# Patient Record
Sex: Female | Born: 2005 | Race: Black or African American | Hispanic: No | Marital: Single | Smoking: Never smoker
Health system: Southern US, Community
[De-identification: ages and names within clinical notes are randomized; demographics above are authoritative.]

## PROBLEM LIST (undated history)

## (undated) ENCOUNTER — Inpatient Hospital Stay (HOSPITAL_COMMUNITY): Payer: Self-pay

## (undated) ENCOUNTER — Emergency Department (HOSPITAL_COMMUNITY): Admission: EM | Payer: Medicaid Other | Source: Home / Self Care

## (undated) DIAGNOSIS — L309 Dermatitis, unspecified: Secondary | ICD-10-CM

## (undated) DIAGNOSIS — J45909 Unspecified asthma, uncomplicated: Secondary | ICD-10-CM

## (undated) DIAGNOSIS — M419 Scoliosis, unspecified: Secondary | ICD-10-CM

## (undated) HISTORY — DX: Scoliosis, unspecified: M41.9

## (undated) HISTORY — PX: NO PAST SURGERIES: SHX2092

---
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
## 2023-01-01 NOTE — ED Provider Notes (Signed)
[name]    CSN: [id number] Arrival date & time: 12/19/22  1848      History   Chief Complaint Chief Complaint  Patient presents with   Possible Pregnancy    HPI Sameya Buccellato is a 17 y.o. female.   Patient here today for evaluation of possible pregnancy as she has missed a period and has had breast tenderness. She has not had any fever. She reports she did take a pregnancy test at home she thought had two lines.   The history is provided by the patient.  Possible Pregnancy Pertinent negatives include no abdominal pain and no shortness of breath.    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 No pertinent surgical history.  OB History   No obstetric history on file.      Home Medications    Prior to Admission medications   Medication Sig Start Date End Date Taking? Authorizing Provider  Blood Pressure Monitoring (BLOOD PRESSURE KIT) DEVI 1 kit by Does not apply route once a week. Check Blood Pressure regularly and record readings into the Babyscripts App.  Large Cuff.  DX O90.0 Patient not taking: Reported on 08/01/2022 07/16/20   Constant, Peggy, MD  fluconazole (DIFLUCAN) 150 MG tablet Take one tab PO today and repeat dose in 3 days if symptoms persist 10/24/22   Tomi Bamberger, PA-C  hydrocortisone 2.5 % cream Apply 1 Application topically 2 (two) times daily. 10/13/22   [provider]  Hydrocortisone 2.5 % KIT Place 1 Application rectally as directed.    [provider]  letrozole (FEMARA) 2.5 MG tablet Take one tablet  daily for 5 days starting on Day 3 of your menstrual cycle (Day 1 is the first day of your period). 09/17/22   Leftwich-Kirby, Wilmer Floor, CNM  metroNIDAZOLE (FLAGYL) 500 MG tablet Take 1 tablet (500 mg total) by mouth 2 (two) times daily. 10/25/22   LampteyBritta Mccreedy, MD  oseltamivir (TAMIFLU) 75 MG capsule Take 1 capsule (75 mg total) by mouth every 12 (twelve) hours. Patient not taking: Reported on 08/01/2022 04/14/22   Tomi Bamberger, PA-C  triamcinolone cream (KENALOG) 0.5 % Apply 1 Application topically 2 (two) times daily. 12/07/21   [provider]  VENTOLIN HFA 108 (90 Base) MCG/ACT inhaler Inhale 2 puffs into the lungs every 6 (six) hours as needed. 08/25/22   [provider]    Family History Family History  Family history unknown: Yes    Social History Social History   Tobacco Use   Smoking status: Some Days    Types: Cigars   Smokeless tobacco: Never  Vaping Use   Vaping status: Never Used  Substance Use Topics   Alcohol use: Not Currently    Comment: Occ   Drug use: Yes    Types: Marijuana    Comment: Occassionally.     Allergies   Other, Peanuts [peanut oil], and Shellfish allergy   Review of Systems Review of Systems  Constitutional:  Negative for chills and fever.  Eyes:  Negative for discharge and redness.  Respiratory:  Negative for shortness of breath.   Gastrointestinal:  Negative for abdominal pain, nausea and vomiting.  Genitourinary:  Positive for menstrual problem. Negative for vaginal discharge.     Physical Exam Triage Vital Signs ED Triage Vitals  Encounter Vitals Group     BP 12/19/22 1954 117/75     Systolic BP Percentile --      Diastolic BP Percentile --      Pulse Rate 12/19/22 1954 66     Resp 12/19/22 1954 18     Temp 12/19/22 1954 98.1 F (36.7 C)     Temp Source 12/19/22 1954 Oral     SpO2 12/19/22 1954 96 %     Weight --      Height --      Head Circumference --      Peak Flow --      Pain Score 12/19/22 1955  0     Pain Loc --      Pain Education --      Exclude from Growth Chart --    No data found.  Updated Vital Signs BP 117/75 (BP Location: Left Arm)   Pulse 66   Temp 98.1 F (36.7 C) (Oral)   Resp 18   SpO2 96%      Physical Exam Vitals and nursing note reviewed.  Constitutional:      General: She is not in acute distress.    Appearance: Normal appearance. She is not ill-appearing.  HENT:     Head: Normocephalic and atraumatic.  Eyes:     Conjunctiva/sclera: Conjunctivae normal.  Cardiovascular:     Rate and Rhythm: Normal rate.  Pulmonary:     Effort: Pulmonary effort is normal. No respiratory distress.  Neurological:     Mental Status: She is alert.  Psychiatric:        Mood and Affect: Mood normal.        Behavior: Behavior normal.        Thought Content: Thought content normal.      UC Treatments / Results  Labs (all labs ordered are listed, but only abnormal results are displayed) Labs Reviewed  POCT URINE PREGNANCY    EKG   Radiology No results found.  Procedures Procedures (including critical care time)  Medications Ordered in UC Medications - No data to display  Initial Impression / Assessment and Plan / UC Course  I have reviewed the triage vital signs and the nursing notes.  Pertinent labs & imaging results that were available during my care of the patient were reviewed by me and considered in my medical decision making (see chart for details).    Pregnancy test negative in office.  Recommended she retake pregnancy screening at home if she does not start her period  In the next several weeks.  Encouraged sooner follow-up with any further concerns.  Final Clinical Impressions(s) / UC Diagnoses   Final diagnoses:  Pregnancy test negative  Feared complaint without diagnosis   Discharge Instructions   None    ED Prescriptions   None    PDMP not reviewed this encounter.   Tomi Bamberger, PA-C 01/01/23 1422

## 2023-03-09 ENCOUNTER — Other Ambulatory Visit (HOSPITAL_COMMUNITY)
Admission: RE | Admit: 2023-03-09 | Discharge: 2023-03-09 | Disposition: A | Discharge status: Home / Self Care | Payer: Medicaid Other | Source: Ambulatory Visit | Attending: Obstetrics | Admitting: Obstetrics

## 2023-03-09 ENCOUNTER — Ambulatory Visit (INDEPENDENT_AMBULATORY_CARE_PROVIDER_SITE_OTHER): Payer: Medicaid Other | Admitting: Obstetrics

## 2023-03-09 ENCOUNTER — Encounter: Payer: Self-pay | Admitting: Obstetrics

## 2023-03-09 DIAGNOSIS — N898 Other specified noninflammatory disorders of vagina: Secondary | ICD-10-CM | POA: Insufficient documentation

## 2023-03-09 DIAGNOSIS — N939 Abnormal uterine and vaginal bleeding, unspecified: Secondary | ICD-10-CM | POA: Diagnosis not present

## 2023-03-09 DIAGNOSIS — Z3202 Encounter for pregnancy test, result negative: Secondary | ICD-10-CM | POA: Diagnosis not present

## 2023-03-09 LAB — POCT URINE PREGNANCY: Preg Test, Ur: NEGATIVE

## 2023-03-09 NOTE — Progress Notes (Signed)
Pt. Presents for std testing. Pt. Wants to discuss irregular periods. Pt. Declines blood testing. Declines birth control.

## 2023-03-09 NOTE — Progress Notes (Unsigned)
Patient ID: Hannah Harrison, female   DOB: Jan 08, 2006, 17 y.o.   MRN: [id number]  No chief complaint on file.   HPI Hannah Harrison is a 17 y.o. female.  Complains of irregular periods.  Contraception:  none.  Also c/o vaginal discharge. HPI  Past Medical History:  Diagnosis Date   Asthma    Eczema    Scoliosis     History reviewed. No pertinent surgical history.  Family History  Family history unknown: Yes    Social History Social History   Tobacco Use   Smoking status: Some Days    Types: Cigars   Smokeless tobacco: Never  Vaping Use   Vaping status: Never Used  Substance Use Topics   Alcohol use: Not Currently    Comment: Occ   Drug use: Yes    Types: Marijuana    Comment: Occassionally.    Allergies  Allergen Reactions   Other     Seasonal allergies    Peanuts [Peanut Oil] Other (See Comments)    Throat swelling   Shellfish Allergy     Hives and throat swelling    Current Outpatient Medications  Medication Sig Dispense Refill   hydrocortisone 2.5 % cream Apply 1 Application topically 2 (two) times daily.     Hydrocortisone 2.5 % KIT Place 1 Application rectally as directed.     triamcinolone cream (KENALOG) 0.5 % Apply 1 Application topically 2 (two) times daily.     VENTOLIN HFA 108 (90 Base) MCG/ACT inhaler Inhale 2 puffs into the lungs every 6 (six) hours as needed.     Blood Pressure Monitoring (BLOOD PRESSURE KIT) DEVI 1 kit by Does not apply route once a week. Check Blood Pressure regularly and record readings into the Babyscripts App.  Large Cuff.  DX O90.0 (Patient not taking: Reported on 03/09/2023) 1 each 0   fluconazole (DIFLUCAN) 150 MG tablet Take one tab PO today and repeat dose in 3 days if symptoms persist (Patient not taking: Reported on 03/09/2023) 2 tablet 0   letrozole (FEMARA) 2.5 MG tablet Take one tablet daily for 5 days starting on Day 3 of your menstrual cycle (Day 1 is the first day of your period). (Patient not taking:  Reported on 03/09/2023) 5 tablet 1   metroNIDAZOLE (FLAGYL) 500 MG tablet Take 1 tablet (500 mg total) by mouth 2 (two) times daily. (Patient not taking: Reported on 03/09/2023) 14 tablet 0   oseltamivir (TAMIFLU) 75 MG capsule Take 1 capsule (75 mg total) by mouth every 12 (twelve) hours. (Patient not taking: Reported on 03/09/2023) 10 capsule 0   No current facility-administered medications for this visit.    Review of Systems Review of Systems Constitutional: negative for fatigue and weight loss Respiratory: negative for cough and wheezing Cardiovascular: negative for chest pain, fatigue and palpitations Gastrointestinal: negative for abdominal pain and change in bowel habits Genitourinary: positive for irregular periods and vaginal discharge Integument/breast: negative for nipple discharge Musculoskeletal:negative for myalgias Neurological: negative for gait problems and tremors Behavioral/Psych: negative for abusive relationship, depression Endocrine: negative for temperature intolerance      Last menstrual period 01/30/2023.  Physical Exam Physical Exam General:   Alert and no discharge  Skin:   no rash or abnormalities  Lungs:   clear to auscultation bilaterally  Heart:   regular rate and rhythm, S1, S2 normal, no murmur, click, rub or gallop  Breasts:   normal without suspicious masses, skin or nipple changes or axillary nodes  Abdomen:  normal findings: no organomegaly, soft, non-tender and no hernia  Pelvis:  External genitalia: normal general appearance Urinary system: urethral meatus normal and bladder without fullness, nontender Vaginal: normal without tenderness, induration or masses Cervix: normal appearance Adnexa: normal bimanual exam Uterus: anteverted and non-tender, normal size    I have spent a total of 20 minutes of face-to-face time, excluding clinical staff time, reviewing notes and preparing to see patient, ordering tests and/or medications, and  counseling the patient.   Data Reviewed Labs Wet Prep  Assessment     1. Abnormal uterine bleeding (AUB) (Primary) - O - POCT urine pregnancy:  Negative - recommended OCP's for cycle regulation  2. Vaginal discharge Rx: - Cervicovaginal ancillary only( [place])     Plan   Follow up prn  Orders Placed This Encounter  Procedures   POCT urine pregnancy    Brock Bad, MD, FACOG Attending Obstetrician & Gynecologist, [facility] for [facility], [facility], [place] 03/09/2023

## 2023-03-12 LAB — CERVICOVAGINAL ANCILLARY ONLY
Bacterial Vaginitis (gardnerella): POSITIVE — AB
Candida Glabrata: NEGATIVE
Candida Vaginitis: NEGATIVE
Chlamydia: NEGATIVE
Comment: NEGATIVE
Comment: NEGATIVE
Comment: NEGATIVE
Comment: NEGATIVE
Comment: NEGATIVE
Comment: NORMAL
Neisseria Gonorrhea: NEGATIVE
Trichomonas: NEGATIVE

## 2023-03-13 ENCOUNTER — Other Ambulatory Visit: Payer: Self-pay | Admitting: Family Medicine

## 2023-03-13 MED ORDER — METRONIDAZOLE 500 MG PO TABS: 500.0000 mg | ORAL_TABLET | Freq: Two times a day (BID) | ORAL | 0 refills | Status: AC

## 2023-03-14 NOTE — L&D Delivery Note (Signed)
 OB/GYN Faculty Practice Delivery Note  Hannah Harrison is a 18 y.o. G1P1001 s/p NVD at [redacted]w[redacted]d. She was admitted for IOL.   ROM: 7h 76m with clear fluid GBS Status: PRESUMPTIVE NEGATIVE/-- (10/17 1806)  Maximum Maternal Temperature: 98.1  Labor Progress: Initial SVE: 4/70/-1. She then progressed to complete.   Delivery Date/Time: 01/26/2024 9:24 AM Delivery: Called to room and patient was complete and pushing. Head delivered straight occiput anterior. nuchal cord absent. The anterior shoulder delivered with ease, followed by body dystocia. Infant was delivered >71min later. Cord was immediately clamped and cut, infant was handed off to the NICU team. Cord blood and venous gas drawn; artery not well visualized in the cord for arterial collection. Placenta delivered spontaneously with gentle cord traction. Fundus firm with massage and Pitocin. Labia, perineum, vagina, and cervix inspected and intact.  Placenta:  spontaneous Intact Complications:none Lacerations: none EBL: Analgesia: Epidural  Newborn Data Living status:Living Gender:Female Apgars:5 ,8  Weight:3420 g           Barabara Maier, DO [facility] for [employer]

## 2023-03-15 ENCOUNTER — Encounter: Payer: Self-pay | Admitting: Family Medicine

## 2023-03-29 ENCOUNTER — Other Ambulatory Visit: Payer: Self-pay

## 2023-03-29 ENCOUNTER — Encounter: Payer: Self-pay | Admitting: *Deleted

## 2023-03-29 ENCOUNTER — Ambulatory Visit
Admission: EM | Admit: 2023-03-29 | Discharge: 2023-03-29 | Disposition: A | Discharge status: Home / Self Care | Payer: Medicaid Other | Attending: Family Medicine | Admitting: Family Medicine

## 2023-03-29 DIAGNOSIS — R3915 Urgency of urination: Secondary | ICD-10-CM | POA: Insufficient documentation

## 2023-03-29 DIAGNOSIS — N898 Other specified noninflammatory disorders of vagina: Secondary | ICD-10-CM | POA: Insufficient documentation

## 2023-03-29 DIAGNOSIS — Z711 Person with feared health complaint in whom no diagnosis is made: Secondary | ICD-10-CM | POA: Insufficient documentation

## 2023-03-29 LAB — POCT URINALYSIS DIP (MANUAL ENTRY)
Bilirubin, UA: NEGATIVE
Blood, UA: NEGATIVE
Glucose, UA: NEGATIVE mg/dL
Ketones, POC UA: NEGATIVE mg/dL
Leukocytes, UA: NEGATIVE
Nitrite, UA: NEGATIVE
Protein Ur, POC: NEGATIVE mg/dL
Spec Grav, UA: 1.025 (ref 1.010–1.025)
Urobilinogen, UA: 0.2 U/dL
pH, UA: 6 (ref 5.0–8.0)

## 2023-03-29 MED ORDER — CEFTRIAXONE SODIUM 500 MG IJ SOLR
500.0000 mg | Freq: Once | INTRAMUSCULAR | Status: AC
  Administered 2023-03-29: 500 mg via INTRAMUSCULAR

## 2023-03-29 MED ORDER — DOXYCYCLINE HYCLATE 100 MG PO CAPS: 100.0000 mg | ORAL_CAPSULE | Freq: Two times a day (BID) | ORAL | 0 refills | Status: DC

## 2023-03-29 NOTE — ED Provider Notes (Signed)
[facility]   [id number] 03/29/23 Arrival Time: 1847  ASSESSMENT & PLAN:  1. Urinary urgency   2. Vaginal discharge   3. Concern about STD in female without diagnosis    Meds ordered this encounter  Medications   doxycycline (VIBRAMYCIN) 100 MG capsule    Sig: Take 1 capsule (100 mg total) by mouth 2 (two) times daily.    Dispense:  14 capsule    Refill:  0   cefTRIAXone (ROCEPHIN) injection 500 mg    Antibiotic Indication::   STD      Discharge Instructions      You have been given the following today for treatment of suspected gonorrhea and/or chlamydia:  cefTRIAXone (ROCEPHIN) injection 500 mg  Please pick up your prescription for doxycycline 100 mg and begin taking twice daily for the next seven (7) days.  Even though we have treated you today, we have sent testing for sexually transmitted infections. We will notify you of any positive results once they are received. If required, we will prescribe any medications you might need.  Please refrain from all sexual activity for at least the next seven days.     Without s/s of PID.  Labs Reviewed  POCT URINALYSIS DIP (MANUAL ENTRY)     Will notify of any positive results. Instructed to refrain from sexual activity for at least seven days.  Reviewed expectations re: course of current medical issues. Questions answered. Outlined signs and symptoms indicating need for more acute intervention. Patient verbalized understanding. After Visit Summary given.   SUBJECTIVE:  Hannah Harrison is a 18 y.o. female who presents with complaint of vaginal discharge and urinary urgency; few days; new female sexual partner. Questions UTI vs STI. No tx PTA.   Patient's last menstrual period was 03/18/2023 (approximate).   OBJECTIVE:  Vitals:   03/29/23 1937  BP: (!) 145/91  Pulse: 65  Resp: 16  Temp: 97.7 F (36.5 C)  TempSrc: Oral  SpO2: 98%    General appearance: alert, cooperative, appears stated  age and no distress Lungs: unlabored respirations; speaks full sentences without difficulty Back: no CVA tenderness; FROM at waist Abdomen: soft, non-tender GU: deferred Skin: warm and dry Psychological: alert and cooperative; normal mood and affect.  Results for orders placed or performed during the hospital encounter of 03/29/23  POCT urinalysis dipstick   Collection Time: 03/29/23  7:47 PM  Result Value Ref Range   Color, UA yellow yellow   Clarity, UA clear clear   Glucose, UA negative negative mg/dL   Bilirubin, UA negative negative   Ketones, POC UA negative negative mg/dL   Spec Grav, UA 4.098 1.191 - 1.025   Blood, UA negative negative   pH, UA 6.0 5.0 - 8.0   Protein Ur, POC negative negative mg/dL   Urobilinogen, UA 0.2 0.2 or 1.0 E.U./dL   Nitrite, UA Negative Negative   Leukocytes, UA Negative Negative    Labs Reviewed  POCT URINALYSIS DIP (MANUAL ENTRY)    Allergies  Allergen Reactions   Other     Seasonal allergies    Peanuts [Peanut Oil] Other (See Comments)    Throat swelling   Shellfish Allergy     Hives and throat swelling    Past Medical History:  Diagnosis Date   Asthma    Eczema    Scoliosis    Family History  Family history unknown: Yes   Social History   Socioeconomic History   Marital status: Single    Spouse  name: Not on file   Number of children: Not on file   Years of education: Not on file   Highest education level: Not on file  Occupational History   Not on file  Tobacco Use   Smoking status: Some Days    Types: Cigars   Smokeless tobacco: Never  Vaping Use   Vaping status: Never Used  Substance and Sexual Activity   Alcohol use: Not Currently    Comment: Occ   Drug use: Not Currently    Types: Marijuana    Comment: Occassionally.   Sexual activity: Yes    Partners: Male    Birth control/protection: None  Other Topics Concern   Not on file  Social History Narrative   ** Merged History Encounter **       **  Merged History Encounter **       Social Drivers of Corporate investment banker Strain: Not on file  Food Insecurity: Not on file  Transportation Needs: Not on file  Physical Activity: Not on file  Stress: Not on file  Social Connections: Not on file  Intimate Partner Violence: Not on file           [place], MD 03/29/23 [phone]

## 2023-03-29 NOTE — ED Triage Notes (Addendum)
Pt reports urinary frequency x 2 days. Denies pain. Reports "regular white discharge". Concerned for UTI. Also would like swab for STI

## 2023-03-29 NOTE — Discharge Instructions (Signed)

## 2023-03-30 ENCOUNTER — Telehealth (HOSPITAL_BASED_OUTPATIENT_CLINIC_OR_DEPARTMENT_OTHER): Payer: Self-pay

## 2023-03-30 LAB — CERVICOVAGINAL ANCILLARY ONLY
Bacterial Vaginitis (gardnerella): POSITIVE — AB
Candida Glabrata: NEGATIVE
Candida Vaginitis: POSITIVE — AB
Chlamydia: NEGATIVE
Comment: NEGATIVE
Comment: NEGATIVE
Comment: NEGATIVE
Comment: NEGATIVE
Comment: NEGATIVE
Comment: NORMAL
Neisseria Gonorrhea: NEGATIVE
Trichomonas: NEGATIVE

## 2023-03-30 MED ORDER — FLUCONAZOLE 150 MG PO TABS: 150.0000 mg | ORAL_TABLET | Freq: Once | ORAL | 0 refills | Status: AC

## 2023-03-30 MED ORDER — METRONIDAZOLE 500 MG PO TABS: 500.0000 mg | ORAL_TABLET | Freq: Two times a day (BID) | ORAL | 0 refills | Status: AC

## 2023-03-30 NOTE — Telephone Encounter (Signed)
Per protocol, pt requires tx with metronidazole and Diflucan.  Rx sent to pharmacy on file.

## 2023-04-01 ENCOUNTER — Encounter: Payer: Self-pay | Admitting: Obstetrics

## 2023-04-01 ENCOUNTER — Encounter: Payer: Self-pay | Admitting: Advanced Practice Midwife

## 2023-04-03 ENCOUNTER — Other Ambulatory Visit: Payer: Self-pay | Admitting: Advanced Practice Midwife

## 2023-04-03 MED ORDER — LETROZOLE 2.5 MG PO TABS: ORAL_TABLET | ORAL | 1 refills | Status: DC

## 2023-04-03 NOTE — Progress Notes (Signed)
Pt sent MyChart message requesting Femara Rx renewal. Rx for Femara was sent July 2024 with one refill.  Pt had follow up with another provider 03/09/23 but fertility was not discussed at that visit. The pt still desires pregnancy and does not want to take OCPs to regulate cycles right now. Rx for lowest dose Femara, 2.5 mg for pt to take Day 3-7 of her cycle with 1 refill.  F/U visit with me in the office in 3 months.

## 2023-05-25 ENCOUNTER — Ambulatory Visit: Payer: Self-pay

## 2023-05-26 ENCOUNTER — Other Ambulatory Visit: Payer: Self-pay

## 2023-05-26 ENCOUNTER — Ambulatory Visit
Admission: EM | Admit: 2023-05-26 | Discharge: 2023-05-26 | Disposition: A | Discharge status: Home / Self Care | Attending: Internal Medicine | Admitting: Internal Medicine

## 2023-05-26 ENCOUNTER — Encounter: Payer: Self-pay | Admitting: Emergency Medicine

## 2023-05-26 DIAGNOSIS — Z3201 Encounter for pregnancy test, result positive: Secondary | ICD-10-CM

## 2023-05-26 LAB — POCT URINE PREGNANCY: Preg Test, Ur: POSITIVE — AB

## 2023-05-26 NOTE — ED Triage Notes (Signed)
 Pt here for possible pregnancy test yesterday day at home; pt sts LMP was in February but unsure of date; pt sts some vaginal dryness

## 2023-05-26 NOTE — Discharge Instructions (Addendum)
 Urine pregnancy test here is positive.  hCG quantitative blood work sent off and this will be available in your MyChart in the next 24 to 48 hours.  Keep your follow-up appointment as scheduled with your OB/GYN on Monday.  List of safe medications given to you today to review.  Vaginal dryness may be associated with the pregnancy and recommend addressing this with your OB/GYN on Monday.  Follow-up as needed here.

## 2023-05-26 NOTE — ED Provider Notes (Signed)
 [name]    CSN: [id number] Arrival date & time: 05/26/23  1512      History   Chief Complaint Chief Complaint  Patient presents with   Possible Pregnancy    HPI Hannah Harrison is a 18 y.o. female.   18 year old female who presents urgent care with complaints of vaginal dryness and possible pregnancy.  The patient reports that her last menstrual period was sometime in February but she is unsure when.  She did a home pregnancy test yesterday that was positive.  She has had some soreness in her breast as well in the last week or 2.  She would like to have a quantitative blood work test done along with the urine pregnancy test.  She has an appointment scheduled with her OB/GYN on Monday.  She denies any abdominal pain, fevers, chills.   Possible Pregnancy Pertinent negatives include no chest pain, no abdominal pain and no shortness of breath.    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 No pertinent surgical history.  OB History     Gravida  1   Para      Term      Preterm      AB      Living         SAB      IAB      Ectopic      Multiple      Live Births               Home Medications    Prior to Admission medications   Medication Sig Start Date End Date Taking? Authorizing Provider  Blood Pressure Monitoring (BLOOD PRESSURE KIT) DEVI 1 kit by Does not apply route once a week. Check Blood Pressure regularly and record readings into the Babyscripts App.  Large Cuff.  DX O90.0 Patient not taking: Reported on  03/09/2023 07/16/20   Constant, Peggy, MD  doxycycline (VIBRAMYCIN) 100 MG capsule Take 1 capsule (100 mg total) by mouth 2 (two) times daily. 03/29/23   Mardella Layman, MD  hydrocortisone 2.5 % cream Apply 1 Application topically 2 (two) times daily. 10/13/22   [provider]  Hydrocortisone 2.5 % KIT Place 1 Application rectally as directed.    [provider]  letrozole (FEMARA) 2.5 MG tablet Take one tablet daily for 5 days starting on Day 3 of your menstrual cycle (Day 1 is the first day of your period). 04/03/23   Leftwich-Kirby, Wilmer Floor, CNM  triamcinolone cream (KENALOG) 0.5 % Apply 1 Application topically 2 (two) times daily. 12/07/21   [provider]  VENTOLIN HFA 108 (90 Base) MCG/ACT inhaler Inhale 2 puffs into the lungs every 6 (six) hours as needed. 08/25/22   [provider]    Family History Family History  Family history unknown: Yes    Social History Social History   Tobacco Use   Smoking status: Some Days    Types: Cigars   Smokeless tobacco: Never  Vaping Use   Vaping status: Never Used  Substance Use Topics  Alcohol use: Not Currently    Comment: Occ   Drug use: Not Currently    Types: Marijuana    Comment: Occassionally.     Allergies   Other, Peanuts [peanut oil], and Shellfish allergy   Review of Systems Review of Systems  Constitutional:  Negative for chills and fever.  HENT:  Negative for ear pain and sore throat.   Eyes:  Negative for pain and visual disturbance.  Respiratory:  Negative for cough and shortness of breath.   Cardiovascular:  Negative for chest pain and palpitations.  Gastrointestinal:  Negative for abdominal pain and vomiting.  Genitourinary:  Negative for dysuria and hematuria.       Vaginal dryness  Musculoskeletal:  Negative for arthralgias and back pain.  Skin:  Negative for color change and rash.  Neurological:  Negative for seizures and syncope.  All other systems reviewed and are  negative.    Physical Exam Triage Vital Signs ED Triage Vitals [05/26/23 1525]  Encounter Vitals Group     BP 123/76     Systolic BP Percentile      Diastolic BP Percentile      Pulse Rate 81     Resp 18     Temp 98.8 F (37.1 C)     Temp Source Oral     SpO2 98 %     Weight      Height      Head Circumference      Peak Flow      Pain Score 0     Pain Loc      Pain Education      Exclude from Growth Chart    No data found.  Updated Vital Signs BP 123/76 (BP Location: Left Arm)   Pulse 81   Temp 98.8 F (37.1 C) (Oral)   Resp 18   LMP  (LMP Unknown)   SpO2 98%   Visual Acuity Right Eye Distance:   Left Eye Distance:   Bilateral Distance:    Right Eye Near:   Left Eye Near:    Bilateral Near:     Physical Exam Vitals and nursing note reviewed.  Constitutional:      General: She is not in acute distress.    Appearance: She is well-developed.  HENT:     Head: Normocephalic and atraumatic.  Eyes:     Conjunctiva/sclera: Conjunctivae normal.  Cardiovascular:     Rate and Rhythm: Normal rate and regular rhythm.     Heart sounds: No murmur heard. Pulmonary:     Effort: Pulmonary effort is normal. No respiratory distress.     Breath sounds: Normal breath sounds.  Abdominal:     General: Bowel sounds are normal.     Palpations: Abdomen is soft.     Tenderness: There is no abdominal tenderness.  Musculoskeletal:        General: No swelling.     Cervical back: Neck supple.  Skin:    General: Skin is warm and dry.     Capillary Refill: Capillary refill takes less than 2 seconds.  Neurological:     Mental Status: She is alert.  Psychiatric:        Mood and Affect: Mood normal.      UC Treatments / Results  Labs (all labs ordered are listed, but only abnormal results are displayed) Labs Reviewed  POCT URINE PREGNANCY - Abnormal; Notable for the following components:      Result Value   Preg Test, Ur  Positive (*)    All other components within  normal limits  BETA HCG QUANT (REF LAB)    EKG   Radiology No results found.  Procedures Procedures (including critical care time)  Medications Ordered in UC Medications - No data to display  Initial Impression / Assessment and Plan / UC Course  I have reviewed the triage vital signs and the nursing notes.  Pertinent labs & imaging results that were available during my care of the patient were reviewed by me and considered in my medical decision making (see chart for details).     Positive pregnancy test - Plan: hCG Quantitative, hCG Quantitative   Urine pregnancy test here is positive.  hCG quantitative blood work sent off and this will be available in your MyChart in the next 24 to 48 hours.  Keep your follow-up appointment as scheduled with your OB/GYN on Monday.  List of safe medications given to you today to review.  Vaginal dryness may be associated with the pregnancy and recommend addressing this with your OB/GYN on Monday.  Follow-up as needed here.  Final Clinical Impressions(s) / UC Diagnoses   Final diagnoses:  Positive pregnancy test     Discharge Instructions      Urine pregnancy test here is positive.  hCG quantitative blood work sent off and this will be available in your MyChart in the next 24 to 48 hours.  Keep your follow-up appointment as scheduled with your OB/GYN on Monday.  List of safe medications given to you today to review.  Vaginal dryness may be associated with the pregnancy and recommend addressing this with your OB/GYN on Monday.  Follow-up as needed here.   ED Prescriptions   None    PDMP not reviewed this encounter.   Landis Martins, [place] 05/26/23 1549

## 2023-05-27 ENCOUNTER — Telehealth: Payer: Self-pay | Admitting: Emergency Medicine

## 2023-05-27 LAB — BETA HCG QUANT (REF LAB): hCG Quant: 2583 m[IU]/mL

## 2023-05-27 NOTE — Telephone Encounter (Signed)
 Returned pt call; informed to keep OB appointment tomorrow and that her lab work indicated she was around [redacted] weeks pregnant

## 2023-05-28 ENCOUNTER — Ambulatory Visit: Admitting: Advanced Practice Midwife

## 2023-05-29 ENCOUNTER — Encounter: Payer: Self-pay | Admitting: Advanced Practice Midwife

## 2023-06-07 ENCOUNTER — Other Ambulatory Visit: Payer: Self-pay

## 2023-06-07 ENCOUNTER — Encounter: Payer: Self-pay | Admitting: Advanced Practice Midwife

## 2023-06-07 MED ORDER — PRENATAL 28-0.8 MG PO TABS: 1.0000 | ORAL_TABLET | Freq: Every day | ORAL | 12 refills | Status: AC

## 2023-06-07 MED ORDER — FLUTICASONE PROPIONATE 50 MCG/ACT NA SUSP: 1.0000 | Freq: Every day | NASAL | 0 refills | Status: DC | PRN

## 2023-06-07 NOTE — Progress Notes (Signed)
 Flonase refill provided due to pt allergies. Flonase on safe medication list for pregnancy, pt has been taking each year for allergies.

## 2023-06-07 NOTE — Progress Notes (Signed)
 PNV rx sent per pt request

## 2023-06-13 ENCOUNTER — Other Ambulatory Visit: Payer: Self-pay | Admitting: *Deleted

## 2023-06-13 ENCOUNTER — Encounter: Payer: Self-pay | Admitting: *Deleted

## 2023-06-13 ENCOUNTER — Encounter

## 2023-06-14 ENCOUNTER — Other Ambulatory Visit (INDEPENDENT_AMBULATORY_CARE_PROVIDER_SITE_OTHER): Payer: Self-pay

## 2023-06-14 ENCOUNTER — Ambulatory Visit: Admitting: *Deleted

## 2023-06-14 ENCOUNTER — Encounter: Payer: Self-pay | Admitting: Advanced Practice Midwife

## 2023-06-14 VITALS — BP 119/75 | HR 78 | Wt 118.9 lb

## 2023-06-14 DIAGNOSIS — Z3A01 Less than 8 weeks gestation of pregnancy: Secondary | ICD-10-CM

## 2023-06-14 DIAGNOSIS — O3680X0 Pregnancy with inconclusive fetal viability, not applicable or unspecified: Secondary | ICD-10-CM

## 2023-06-14 DIAGNOSIS — Z3401 Encounter for supervision of normal first pregnancy, first trimester: Secondary | ICD-10-CM | POA: Diagnosis not present

## 2023-06-14 DIAGNOSIS — Z3480 Encounter for supervision of other normal pregnancy, unspecified trimester: Secondary | ICD-10-CM

## 2023-06-14 MED ORDER — BLOOD PRESSURE KIT DEVI: 1.0000 | 0 refills | Status: DC

## 2023-06-14 NOTE — Progress Notes (Signed)
 New OB Intake  I connected with Hannah Harrison  on 06/14/23 at 10:15 AM EDT by In Person Visit and verified that I am speaking with the correct person using two identifiers. Nurse is located at [facility] and pt is located at [place].  I discussed the limitations, risks, security and privacy concerns of performing an evaluation and management service by telephone and the availability of in person appointments. I also discussed with the patient that there may be a patient responsible charge related to this service. The patient expressed understanding and agreed to proceed.  I explained I am completing New OB Intake today. We discussed EDD of 01/29/2024, by Ultrasound. Pt is G1P0000. I reviewed her allergies, medications and Medical/Surgical/OB history.    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. Discussed the nature of our practice with multiple providers including residents and students. Due to the size of the practice, the delivering provider may not be the same as those providing prenatal care.   Patient is not interested in water birth. Offered upcoming OB visit with CNM to discuss further.  MyChart/Babyscripts MyChart access verified. I explained pt will have some visits in office and some virtually. Babyscripts instructions given and order placed. Patient verifies receipt of registration text/e-mail. Account successfully created and app downloaded. If patient is a candidate for Optimized scheduling, add to sticky note.   Blood Pressure  Cuff/Weight Scale Blood pressure cuff ordered for patient to pick-up from [employer]. Explained after first prenatal appt pt will check weekly and document in Babyscripts. Patient does not have weight scale; patient may purchase if they desire to track weight weekly in Babyscripts.  Anatomy US Explained first scheduled [place] will be around 19 weeks. Anatomy US scheduled for TBD at TBD.  Interested in [place]? If yes, send referral and doula dot phrase.   Is patient a candidate for Babyscripts Optimization? Yes, patient accepted    First visit review I reviewed new OB appt with patient. Explained pt will be seen by Sharen Counter, CNM  at first visit. Discussed Avelina Laine genetic screening with patient. Requests Panorama and Horizon.. Routine prenatal labs  OB Urine, GC/CC only collected at today's visit. Initial OB labs and Natera deferred to New OB appt.    Last Pap No results found for: "DIAGPAP"  Harrel Lemon, RN 06/14/2023  1:48 PM

## 2023-06-15 ENCOUNTER — Telehealth: Payer: Self-pay

## 2023-06-16 ENCOUNTER — Encounter: Payer: Self-pay | Admitting: Advanced Practice Midwife

## 2023-06-16 LAB — URINE CULTURE, OB REFLEX

## 2023-06-16 LAB — CULTURE, OB URINE

## 2023-07-03 ENCOUNTER — Other Ambulatory Visit (HOSPITAL_COMMUNITY)
Admission: RE | Admit: 2023-07-03 | Discharge: 2023-07-03 | Disposition: A | Discharge status: Home / Self Care | Source: Ambulatory Visit | Attending: Advanced Practice Midwife | Admitting: Advanced Practice Midwife

## 2023-07-03 ENCOUNTER — Encounter: Payer: Self-pay | Admitting: Advanced Practice Midwife

## 2023-07-03 ENCOUNTER — Ambulatory Visit: Admitting: Advanced Practice Midwife

## 2023-07-03 VITALS — BP 125/71 | HR 82 | Wt 120.8 lb

## 2023-07-03 DIAGNOSIS — Z3A10 10 weeks gestation of pregnancy: Secondary | ICD-10-CM | POA: Diagnosis not present

## 2023-07-03 DIAGNOSIS — Z3401 Encounter for supervision of normal first pregnancy, first trimester: Secondary | ICD-10-CM

## 2023-07-03 DIAGNOSIS — Z3480 Encounter for supervision of other normal pregnancy, unspecified trimester: Secondary | ICD-10-CM | POA: Diagnosis present

## 2023-07-03 DIAGNOSIS — Z31430 Encounter of female for testing for genetic disease carrier status for procreative management: Secondary | ICD-10-CM | POA: Diagnosis not present

## 2023-07-03 DIAGNOSIS — O219 Vomiting of pregnancy, unspecified: Secondary | ICD-10-CM

## 2023-07-03 DIAGNOSIS — B3731 Acute candidiasis of vulva and vagina: Secondary | ICD-10-CM

## 2023-07-03 MED ORDER — TERCONAZOLE 0.4 % VA CREA: 1.0000 | TOPICAL_CREAM | Freq: Every day | VAGINAL | 0 refills | Status: DC

## 2023-07-03 MED ORDER — ONDANSETRON HCL 4 MG PO TABS: 4.0000 mg | ORAL_TABLET | Freq: Three times a day (TID) | ORAL | 0 refills | Status: DC | PRN

## 2023-07-03 NOTE — Progress Notes (Signed)
 Pt states she is going to pick up her BP kit today.   Pt is concerned she may have a yeast infection.

## 2023-07-03 NOTE — Progress Notes (Signed)
 Subjective:   Hannah Harrison is a 18 y.o. G1P0000 at [redacted]w[redacted]d by early ultrasound being seen today for her first obstetrical visit. This is a planned pregnancy. Pt was taking ovulation stimulation medication to conceive.  Her obstetrical history is significant for  none G1  and has Intentional selective serotonin reuptake inhibitor overdose (HCC); Intentional drug overdose (HCC); Adjustment disorder with disturbance of conduct; PCOS (polycystic ovarian syndrome); Primary oligomenorrhea; Desire for pregnancy; and Supervision of other normal pregnancy, antepartum on their problem list.. Patient does intend to breast feed. Pregnancy history fully reviewed.  Patient reports  mild nausea with occasional vomiting .  HISTORY: OB History  Gravida Para Term Preterm AB Living  1 0 0 0 0 0  SAB IAB Ectopic Multiple Live Births  0 0 0 0 0    # Outcome Date GA Lbr Len/2nd Weight Sex Type Anes PTL Lv  1 Current            Past Medical History:  Diagnosis Date   Asthma    Eczema    Scoliosis    Past Surgical History:  Procedure Laterality Date   NO PAST SURGERIES     Family History  Problem Relation Age of Onset   Hyperlipidemia Maternal Grandmother    Social History   Tobacco Use   Smoking status: Former    Types: Cigars   Smokeless tobacco: Never  Vaping Use   Vaping status: Never Used  Substance Use Topics   Alcohol use: Not Currently    Comment: Occ   Drug use: Not Currently    Types: Marijuana    Comment: Occassionally.   Allergies  Allergen Reactions   Other     Seasonal allergies    Peanuts [Peanut Oil] Other (See Comments)    Throat swelling   Shellfish Allergy     Hives and throat swelling   Current Outpatient Medications on File Prior to Visit  Medication Sig Dispense Refill   Prenatal 28-0.8 MG TABS Take 1 tablet by mouth daily. 30 tablet 12   Blood Pressure Monitoring (BLOOD PRESSURE KIT) DEVI 1 kit by Does not apply route once a week. Check Blood  Pressure regularly and record readings into the Babyscripts App.  Large Cuff.  DX O90.0 (Patient not taking: Reported on 08/01/2022) 1 each 0   Blood Pressure Monitoring (BLOOD PRESSURE KIT) DEVI 1 Device by Does not apply route once a week. (Patient not taking: Reported on 07/03/2023) 1 each 0   doxycycline  (VIBRAMYCIN ) 100 MG capsule Take 1 capsule (100 mg total) by mouth 2 (two) times daily. (Patient not taking: Reported on 07/03/2023) 14 capsule 0   fluticasone  (ALLERGY SPRAY 24 HOUR) 50 MCG/ACT nasal spray Place 1 spray into both nostrils daily as needed (allergies). (Patient not taking: Reported on 07/03/2023) 16 g 0   hydrocortisone 2.5 % cream Apply 1 Application topically 2 (two) times daily. (Patient not taking: Reported on 07/03/2023)     Hydrocortisone 2.5 % KIT Place 1 Application rectally as directed. (Patient not taking: Reported on 07/03/2023)     letrozole  (FEMARA ) 2.5 MG tablet Take one tablet daily for 5 days starting on Day 3 of your menstrual cycle (Day 1 is the first day of your period). (Patient not taking: Reported on 07/03/2023) 5 tablet 1   triamcinolone  cream (KENALOG ) 0.5 % Apply 1 Application topically 2 (two) times daily. (Patient not taking: Reported on 07/03/2023)     VENTOLIN  HFA 108 (90 Base) MCG/ACT inhaler  Inhale 2 puffs into the lungs every 6 (six) hours as needed. (Patient not taking: Reported on 07/03/2023)     No current facility-administered medications on file prior to visit.     Indications for ASA therapy (per uptodate) One of the following: Previous pregnancy with preeclampsia, especially early onset and with an adverse outcome No Multifetal gestation No Chronic hypertension No Type 1 or 2 diabetes mellitus No Chronic kidney disease No Autoimmune disease (antiphospholipid syndrome, systemic lupus erythematosus) No   Two or more of the following: Nulliparity Yes Obesity (body mass index >30 kg/m2) No Family history of preeclampsia in mother or sister  No Age >=35 years No Sociodemographic characteristics (African American race, low socioeconomic level) Yes Personal risk factors (eg, previous pregnancy with low birth weight or small for gestational age infant, previous adverse pregnancy outcome [eg, stillbirth], interval >10 years between pregnancies) No   Indications for early 1 hour GTT (per uptodate) No BMI 20  Exam   Vitals:   07/03/23 1324  BP: 125/71  Pulse: 82  Weight: 120 lb 12.8 oz (54.8 kg)   Fetal Heart Rate (bpm): 170  VS reviewed, nursing note reviewed,  Constitutional: well developed, well nourished, no distress HEENT: normocephalic CV: normal rate Pulm/chest wall: normal effort Abdomen: soft Neuro: alert and oriented x 3 Skin: warm, dry Psych: affect normal    Assessment:   Pregnancy: G1P0000 Patient Active Problem List   Diagnosis Date Noted   Supervision of other normal pregnancy, antepartum 06/14/2023   Desire for pregnancy 08/02/2022   PCOS (polycystic ovarian syndrome) 01/06/2021   Primary oligomenorrhea 01/06/2021   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 Supervision of other normal pregnancy, antepartum (Primary) --Anticipatory guidance about next visits/weeks of pregnancy given.   - CBC/D/Plt+RPR+Rh+ABO+RubIgG... - Cervicovaginal ancillary only( [place])  2. [redacted] weeks gestation of pregnancy  - CBC/D/Plt+RPR+Rh+ABO+RubIgG... - Cervicovaginal ancillary only( [place])  3. Encounter for supervision of normal first pregnancy in first trimester  - PANORAMA PRENATAL TEST  4. Encounter of female for testing for genetic disease carrier status for procreative management  - HORIZON Basic Panel  5. Nausea and vomiting during pregnancy prior to [redacted] weeks gestation --Pt reports getting up early for work on the weekends is hard with  n/v.  Will treat n/v first with medication. Pt to notify office if she is still unable to work in the mornings.  - ondansetron  (ZOFRAN ) 4 MG tablet; Take 1 tablet (4 mg total) by mouth every 8 (eight) hours as needed for nausea or vomiting.  Dispense: 20 tablet; Refill: 0   6. Vaginal candidiasis --Pt tx for yeast recently, symptoms returned.  Swab collected today.  - terconazole  (TERAZOL 7 ) 0.4 % vaginal cream; Place 1 applicator vaginally at bedtime.  Dispense: 45 g; Refill: 0   Initial labs drawn. Continue prenatal vitamins. Discussed and offered genetic screening options, including Quad screen/AFP, NIPS testing, and option to decline testing. Benefits/risks/alternatives reviewed. Pt aware that anatomy US  is form of genetic screening with lower accuracy in detecting trisomies than blood work.  Pt chooses genetic screening today. NIPS: ordered. Ultrasound discussed; fetal anatomic survey: ordered. Problem list reviewed and updated. The nature of [facility] with multiple MDs and other Advanced Practice Providers was explained to patient; also emphasized that residents, students are part of our team. Routine  obstetric precautions reviewed. Return in about 4 weeks (around 07/31/2023) for LOB.   Arlester Bence, CNM 07/03/23 2:07 PM

## 2023-07-03 NOTE — Progress Notes (Signed)
Pt presents for NOB visit.

## 2023-07-04 LAB — CERVICOVAGINAL ANCILLARY ONLY
Bacterial Vaginitis (gardnerella): POSITIVE — AB
Candida Glabrata: NEGATIVE
Candida Vaginitis: POSITIVE — AB
Chlamydia: NEGATIVE
Comment: NEGATIVE
Comment: NEGATIVE
Comment: NEGATIVE
Comment: NEGATIVE
Comment: NEGATIVE
Comment: NORMAL
Neisseria Gonorrhea: NEGATIVE
Trichomonas: NEGATIVE

## 2023-07-05 ENCOUNTER — Telehealth: Payer: Self-pay

## 2023-07-05 LAB — CBC/D/PLT+RPR+RH+ABO+RUBIGG...
Antibody Screen: NEGATIVE
Basophils Absolute: 0 10*3/uL (ref 0.0–0.2)
Basos: 1 %
EOS (ABSOLUTE): 0.2 10*3/uL (ref 0.0–0.4)
Eos: 3 %
HCV Ab: NONREACTIVE
HIV Screen 4th Generation wRfx: NONREACTIVE
Hematocrit: 36.3 % (ref 34.0–46.6)
Hemoglobin: 12.5 g/dL (ref 11.1–15.9)
Hepatitis B Surface Ag: NEGATIVE
Immature Grans (Abs): 0 10*3/uL (ref 0.0–0.1)
Immature Granulocytes: 0 %
Lymphocytes Absolute: 1.7 10*3/uL (ref 0.7–3.1)
Lymphs: 23 %
MCH: 31.7 pg (ref 26.6–33.0)
MCHC: 34.4 g/dL (ref 31.5–35.7)
MCV: 92 fL (ref 79–97)
Monocytes Absolute: 0.6 10*3/uL (ref 0.1–0.9)
Monocytes: 8 %
Neutrophils Absolute: 4.7 10*3/uL (ref 1.4–7.0)
Neutrophils: 65 %
Platelets: 218 10*3/uL (ref 150–450)
RBC: 3.94 x10E6/uL (ref 3.77–5.28)
RDW: 14.6 % (ref 11.7–15.4)
RPR Ser Ql: NONREACTIVE
Rh Factor: POSITIVE
Rubella Antibodies, IGG: 2.81 {index} (ref >0.99)
WBC: 7.2 10*3/uL (ref 3.4–10.8)

## 2023-07-05 LAB — HCV INTERPRETATION

## 2023-07-05 NOTE — Telephone Encounter (Signed)
 TC from [facility] questioning lab results. Wanted to know about CBC, blood type, etc. Informed of AB+ blood type, normal hemoglobin/iron levels, informed of no STDs and no abnormal readings on her blood results. Pt voiced understanding.

## 2023-07-06 ENCOUNTER — Other Ambulatory Visit: Payer: Self-pay

## 2023-07-06 MED ORDER — METRONIDAZOLE 500 MG PO TABS: 500.0000 mg | ORAL_TABLET | Freq: Two times a day (BID) | ORAL | 0 refills | Status: DC

## 2023-07-06 NOTE — Progress Notes (Signed)
Flagyl rx sent for BV

## 2023-07-08 LAB — PANORAMA PRENATAL TEST FULL PANEL:PANORAMA TEST PLUS 5 ADDITIONAL MICRODELETIONS: FETAL FRACTION: 9.6

## 2023-07-09 ENCOUNTER — Other Ambulatory Visit: Payer: Self-pay | Admitting: *Deleted

## 2023-07-09 DIAGNOSIS — B9689 Other specified bacterial agents as the cause of diseases classified elsewhere: Secondary | ICD-10-CM

## 2023-07-09 MED ORDER — METRONIDAZOLE 0.75 % VA GEL: 1.0000 | Freq: Every day | VAGINAL | 1 refills | Status: DC

## 2023-07-09 NOTE — Progress Notes (Signed)
 Pt requested RX Metrogel  instead of PO Flagyl . RX sent per protocol.

## 2023-07-11 ENCOUNTER — Encounter: Payer: Self-pay | Admitting: Advanced Practice Midwife

## 2023-07-11 LAB — HORIZON CUSTOM: REPORT SUMMARY: NEGATIVE

## 2023-08-02 ENCOUNTER — Ambulatory Visit (INDEPENDENT_AMBULATORY_CARE_PROVIDER_SITE_OTHER): Admitting: Obstetrics and Gynecology

## 2023-08-02 ENCOUNTER — Encounter: Payer: Self-pay | Admitting: Obstetrics and Gynecology

## 2023-08-02 VITALS — BP 128/75 | HR 77 | Wt 129.4 lb

## 2023-08-02 DIAGNOSIS — Z3A14 14 weeks gestation of pregnancy: Secondary | ICD-10-CM

## 2023-08-02 DIAGNOSIS — Z3480 Encounter for supervision of other normal pregnancy, unspecified trimester: Secondary | ICD-10-CM | POA: Diagnosis not present

## 2023-08-02 NOTE — Patient Instructions (Signed)

## 2023-08-02 NOTE — Progress Notes (Signed)
No concerns at this time. `

## 2023-08-02 NOTE — Progress Notes (Signed)
   PRENATAL VISIT NOTE  Subjective:  Hannah Harrison is a 18 y.o. G1P0000 at [redacted]w[redacted]d being seen today for ongoing prenatal care.  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  Patient reports no complaints.  Contractions: Not present. Vag. Bleeding: None.  Movement: Increased. Denies leaking of fluid.   The following portions of the patient's history were reviewed and updated as appropriate: allergies, current medications, past family history, past medical history, past social history, past surgical history and problem list.   Objective:    Vitals:   08/02/23 1001  BP: 128/75  Pulse: 77  Weight: 129 lb 6.4 oz (58.7 kg)    Fetal Status:  Fetal Heart Rate (bpm): 147   Movement: Increased    General: Alert, oriented and cooperative. Patient is in no acute distress.  Skin: Skin is warm and dry. No rash noted.   Cardiovascular: Normal heart rate noted  Respiratory: Normal respiratory effort, no problems with respiration noted  Abdomen: Soft, gravid, appropriate for gestational age.  Pain/Pressure: Absent     Pelvic: Cervical exam deferred        Extremities: Normal range of motion.  Edema: None  Mental Status: Normal mood and affect. Normal behavior. Normal judgment and thought content.   Assessment and Plan:  Pregnancy: G1P0000 at [redacted]w[redacted]d 1. Supervision of other normal pregnancy, antepartum (Primary) BP and FHR normal Doing well overall    2. [redacted] weeks gestation of pregnancy Anatomy scan 6/24 Peds list given today   Preterm labor symptoms and general obstetric precautions including but not limited to vaginal bleeding, contractions,  leaking of fluid and fetal movement were reviewed in detail with the patient. Please refer to After Visit Summary for other counseling recommendations.   Return in about 4 weeks (around 08/30/2023) for OB VISIT (MD or APP).  Future Appointments  Date Time Provider Department Center  09/04/2023  1:00 PM [facility] PROVIDER 1 [facility]  09/04/2023  1:30 PM [facility] US3 [facility]    Susi Eric, FNP

## 2023-08-03 ENCOUNTER — Other Ambulatory Visit: Payer: Self-pay | Admitting: Obstetrics

## 2023-08-31 ENCOUNTER — Ambulatory Visit

## 2023-09-03 ENCOUNTER — Encounter: Payer: Self-pay | Admitting: Obstetrics and Gynecology

## 2023-09-03 ENCOUNTER — Ambulatory Visit (INDEPENDENT_AMBULATORY_CARE_PROVIDER_SITE_OTHER): Admitting: Obstetrics and Gynecology

## 2023-09-03 VITALS — BP 121/79 | HR 81 | Wt 142.5 lb

## 2023-09-03 DIAGNOSIS — Z3480 Encounter for supervision of other normal pregnancy, unspecified trimester: Secondary | ICD-10-CM | POA: Diagnosis not present

## 2023-09-03 DIAGNOSIS — Z3A18 18 weeks gestation of pregnancy: Secondary | ICD-10-CM | POA: Diagnosis not present

## 2023-09-03 NOTE — Progress Notes (Signed)
   PRENATAL VISIT NOTE  Subjective:  Hannah Harrison is a 18 y.o. G1P0000 at [redacted]w[redacted]d being seen today for ongoing prenatal care.  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  Patient reports no complaints.  Contractions: Not present. Vag. Bleeding: None.  Movement: Increased. Denies leaking of fluid.   The following portions of the patient's history were reviewed and updated as appropriate: allergies, current medications, past family history, past medical history, past social history, past surgical history and problem list.   Objective:    Vitals:   09/03/23 0938  BP: 121/79  Pulse: 81  Weight: 142 lb 8 oz (64.6 kg)    Fetal Status:  Fetal Heart Rate (bpm): 159   Movement: Increased    General: Alert, oriented and cooperative. Patient is in no acute distress.  Skin: Skin is warm and dry. No rash noted.   Cardiovascular: Normal heart rate noted  Respiratory: Normal respiratory effort, no problems with respiration noted  Abdomen: Soft, gravid, appropriate for gestational age.  Pain/Pressure: Present     Pelvic: Cervical exam deferred        Extremities: Normal range of motion.  Edema: None  Mental Status: Normal mood and affect. Normal behavior. Normal judgment and thought content.   Assessment and Plan:  Pregnancy: G1P0000 at [redacted]w[redacted]d 1. [redacted] weeks gestation of pregnancy   2. Supervision of other normal pregnancy, antepartum (Primary) Patient is doing well without complaints AFP today Anatomy ultrasound scheduled tomorrow  Preterm labor symptoms and general obstetric precautions including but not limited to vaginal bleeding, contractions, leaking of fluid and fetal movement were  reviewed in detail with the patient. Please refer to After Visit Summary for other counseling recommendations.   Return in about 4 weeks (around 10/01/2023) for in person, ROB, Low risk.  Future Appointments  Date Time Provider Department Center  09/04/2023  1:00 PM [facility] PROVIDER 1 [facility]  09/04/2023  1:30 PM [facility] US1 [facility]    Winton Felt, MD

## 2023-09-03 NOTE — Progress Notes (Signed)
 Pt states everything is going well. No concerns at this time.

## 2023-09-04 ENCOUNTER — Ambulatory Visit: Attending: Obstetrics and Gynecology

## 2023-09-04 ENCOUNTER — Ambulatory Visit (HOSPITAL_BASED_OUTPATIENT_CLINIC_OR_DEPARTMENT_OTHER): Admitting: Maternal & Fetal Medicine

## 2023-09-04 VITALS — BP 118/65 | HR 84

## 2023-09-04 DIAGNOSIS — Z8659 Personal history of other mental and behavioral disorders: Secondary | ICD-10-CM | POA: Insufficient documentation

## 2023-09-04 DIAGNOSIS — Z3480 Encounter for supervision of other normal pregnancy, unspecified trimester: Secondary | ICD-10-CM | POA: Diagnosis present

## 2023-09-04 DIAGNOSIS — Z3689 Encounter for other specified antenatal screening: Secondary | ICD-10-CM | POA: Insufficient documentation

## 2023-09-04 DIAGNOSIS — Z3A19 19 weeks gestation of pregnancy: Secondary | ICD-10-CM

## 2023-09-04 DIAGNOSIS — E282 Polycystic ovarian syndrome: Secondary | ICD-10-CM | POA: Insufficient documentation

## 2023-09-04 DIAGNOSIS — O2692 Pregnancy related conditions, unspecified, second trimester: Secondary | ICD-10-CM | POA: Diagnosis not present

## 2023-09-04 NOTE — Progress Notes (Signed)
 Patient information  Patient Name: Hannah Harrison [facility]  Patient MRN:   [id number]  Referring practice: MFM Referring Provider: [place] [facility]  Problem List   Patient Active Problem List   Diagnosis Date Noted   History of suicidal ideation (2020) 09/04/2023   Supervision of other normal pregnancy, antepartum 06/14/2023   PCOS (polycystic ovarian syndrome) 01/06/2021    Maternal Fetal Medicine Consult Hannah Harrison is a 18 y.o. G1P0000 at [redacted]w[redacted]d here for ultrasound and consultation. She had Low risk aneuploidy screening of a female fetus. Carrier screening was Negative for the basic screening (SMA, alpha-thal, beta-thal, and cystic fibroisis. Maternal serum AFP n/a. She has no acute concerns.   Sonographic findings Single intrauterine pregnancy at 19w 0d. Fetal cardiac activity:  Observed and appears normal. Presentation: Transverse, head to maternal right. The anatomic structures that were well seen appear normal without evidence of soft markers. The anatomic survey is complete.  Fetal biometry shows the estimated fetal weight at the 80 percentile. Amniotic fluid: Within normal limits.  MVP: 7.38 cm. Placenta: Anterior. Adnexa: No adnexal mass visualized. Cervical length: 4 cm.  There are limitations of prenatal ultrasound such as the inability to detect certain abnormalities due to poor visualization. Various factors such as fetal position, gestational age and maternal body habitus may increase the difficulty in visualizing the fetal anatomy.    Recommendations -EDD should be 01/29/2024 based on  Early Ultrasound  (06/14/23). -No further ultrasounds are recommended at this time based on the current indications. If future indications arise (e.g. size/date discrepancy on fundal height, gestational diabetes or hypertension) and an ultrasound is to be desired at our MFM office, please send a referral.   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 16 mL 5   Prenatal 28-0.8 MG TABS Take 1 tablet by mouth daily. 30 tablet 12   VENTOLIN  HFA 108 (90 Base) MCG/ACT inhaler Inhale 2 puffs into the lungs every 6 (six) hours as needed.     Blood Pressure Monitoring (BLOOD PRESSURE KIT) DEVI 1 kit by Does not apply route once a week. Check Blood Pressure regularly and record readings into the Babyscripts App.  Large Cuff.  DX O90.0 (Patient not taking: Reported on 09/03/2023) 1 each 0   Blood Pressure Monitoring (BLOOD PRESSURE KIT) DEVI 1 Device by Does not apply route once a week. (Patient not taking: Reported on  09/03/2023) 1 each 0   Hydrocortisone 2.5 % KIT Place 1 Application rectally as directed. (Patient not taking: Reported on 09/03/2023)     metroNIDAZOLE  (METROGEL ) 0.75 % vaginal gel Place 1 Applicatorful vaginally at bedtime. Apply one applicatorful to vagina at bedtime for 5 days (Patient not taking: Reported on 09/04/2023) 70 g 1   triamcinolone  cream (KENALOG ) 0.5 % Apply 1 Application topically 2 (two) times daily. (Patient not taking: Reported on  09/03/2023)     No current facility-administered medications on file prior to visit.      Allergies:   Allergies  Allergen Reactions   Other     Seasonal allergies    Peanuts [Peanut Oil] Other (See Comments)    Throat swelling   Shellfish Allergy     Hives and throat swelling     Physical Exam:   Vitals:   09/04/23 1327  BP: 118/65  Pulse: 84   Sitting comfortably on the sonogram table Nonlabored breathing Normal rate and rhythm Abdomen is nontender  Thank you for the opportunity to be involved with this patient's care. Please let us  know if we can be of any further assistance.   30 minutes of time was spent reviewing the patient's chart including labs, imaging and documentation.  At least 50% of this time was spent with direct patient care discussing the diagnosis, management and prognosis of her care.  Hannah Harrison MFM, [place]   09/04/2023  2:18 PM

## 2023-09-05 LAB — AFP, SERUM, OPEN SPINA BIFIDA
AFP MoM: 1.26
AFP Value: 67.6 ng/mL
Gest. Age on Collection Date: 18.6 wk
Maternal Age At EDD: 20.8 a
OSBR Risk 1 IN: 10000
Test Results:: NEGATIVE
Weight: 142 [lb_av]

## 2023-09-09 ENCOUNTER — Encounter: Payer: Self-pay | Admitting: Advanced Practice Midwife

## 2023-09-19 ENCOUNTER — Encounter (HOSPITAL_COMMUNITY): Payer: Self-pay | Admitting: Obstetrics and Gynecology

## 2023-09-19 ENCOUNTER — Inpatient Hospital Stay (HOSPITAL_COMMUNITY)
Admission: AD | Admit: 2023-09-19 | Discharge: 2023-09-19 | Disposition: A | Discharge status: Home / Self Care | Attending: Obstetrics and Gynecology | Admitting: Obstetrics and Gynecology

## 2023-09-19 DIAGNOSIS — N949 Unspecified condition associated with female genital organs and menstrual cycle: Secondary | ICD-10-CM

## 2023-09-19 DIAGNOSIS — R102 Pelvic and perineal pain: Secondary | ICD-10-CM | POA: Diagnosis not present

## 2023-09-19 DIAGNOSIS — R109 Unspecified abdominal pain: Secondary | ICD-10-CM | POA: Diagnosis not present

## 2023-09-19 DIAGNOSIS — O26892 Other specified pregnancy related conditions, second trimester: Secondary | ICD-10-CM

## 2023-09-19 DIAGNOSIS — Z711 Person with feared health complaint in whom no diagnosis is made: Secondary | ICD-10-CM | POA: Insufficient documentation

## 2023-09-19 DIAGNOSIS — Z3480 Encounter for supervision of other normal pregnancy, unspecified trimester: Secondary | ICD-10-CM

## 2023-09-19 DIAGNOSIS — Z3A21 21 weeks gestation of pregnancy: Secondary | ICD-10-CM

## 2023-09-19 LAB — URINALYSIS, ROUTINE W REFLEX MICROSCOPIC
Bilirubin Urine: NEGATIVE
Glucose, UA: NEGATIVE mg/dL
Hgb urine dipstick: NEGATIVE
Ketones, ur: NEGATIVE mg/dL
Leukocytes,Ua: NEGATIVE
Nitrite: NEGATIVE
Protein, ur: NEGATIVE mg/dL
Specific Gravity, Urine: 1.009 (ref 1.005–1.030)
pH: 7 (ref 5.0–8.0)

## 2023-09-19 LAB — WET PREP, GENITAL
Clue Cells Wet Prep HPF POC: NONE SEEN
Sperm: NONE SEEN
Trich, Wet Prep: NONE SEEN
WBC, Wet Prep HPF POC: <10 (ref <10)
Yeast Wet Prep HPF POC: NONE SEEN

## 2023-09-19 MED ORDER — ACETAMINOPHEN 500 MG PO TABS
1000.0000 mg | ORAL_TABLET | Freq: Once | ORAL | Status: AC
  Administered 2023-09-19: 1000 mg via ORAL
  Filled 2023-09-19: qty 2

## 2023-09-19 NOTE — MAU Note (Signed)
 Hannah Harrison is a 18 y.o. at [redacted]w[redacted]d here in MAU reporting: pain in pressure in her pelvis and vagina that started this morning. Denies any vag bleeding or discharge  LMP:  Onset of complaint: this morning Pain score: 9 Vitals:   09/19/23 0743  BP: 115/73  Pulse: 85  Resp: 18  Temp: 98.4 F (36.9 C)     FHT: 153  Lab orders placed from triage: u/a

## 2023-09-19 NOTE — MAU Provider Note (Signed)
 History     CSN: [id number]  Arrival date and time: 09/19/23 9270   Event Date/Time   First Provider Initiated Contact with Patient 09/19/23 0831      Chief Complaint  Patient presents with   Pelvic Pain   Hannah Harrison , a  18 y.o. G1P0000 at [redacted]w[redacted]d presents to MAU with complaints of abdominal distention that started at 4 AM.  Patient describes the pain ongoing being more of a pressure than pain currently rates as a 9 in her lower right abdomen. Denies attempting to relief symptoms. Also denies vaginal bleeding, leaking of fluid, abnormal vaginal discharge and urinary symptoms. She reports that she has felt this before however states that it has been intermittent versus now is more constant. Denies problems with constipation and nausea/vomiting. Endorses positive fetal movement.         OB History     Gravida  1   Para  0   Term  0   Preterm  0   AB  0   Living  0      SAB  0   IAB  0   Ectopic  0   Multiple  0   Live Births  0           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   Asthma    Eczema    Intentional drug overdose (HCC) 02/28/2019   Intentional selective serotonin reuptake inhibitor overdose (HCC) 02/28/2019   Primary oligomenorrhea 01/06/2021   Scoliosis     Past Surgical History:  Procedure Laterality Date   NO PAST SURGERIES      Family History  Problem Relation Age of Onset   Hyperlipidemia Maternal Grandmother     Social History   Tobacco Use   Smoking status: Former    Types: Cigars   Smokeless tobacco: Never  Vaping Use   Vaping status: Never Used  Substance Use Topics   Alcohol use: Not Currently    Comment: Occ   Drug use: Not Currently    Types: Marijuana    Comment: Occassionally.    Allergies:  Allergies  Allergen Reactions   Other     Seasonal allergies    Peanuts [Peanut Oil] Other (See Comments)    Throat swelling   Shellfish  Allergy     Hives and throat swelling    Medications Prior to Admission  Medication Sig Dispense Refill Last Dose/Taking   Prenatal 28-0.8 MG TABS Take 1 tablet by mouth daily. 30 tablet 12 09/18/2023   Blood Pressure Monitoring (BLOOD PRESSURE KIT) DEVI 1 kit by Does not apply route once a week. Check Blood Pressure regularly and record readings into the Babyscripts App.  Large Cuff.  DX O90.0 (Patient not taking: Reported on 09/03/2023) 1 each 0    Blood Pressure Monitoring (BLOOD PRESSURE KIT) DEVI 1 Device by Does not apply route once a week. (Patient not taking: Reported on 09/03/2023) 1 each 0    fluticasone  (FLONASE ) 50 MCG/ACT nasal spray PLACE 1 SPRAY INTO BOTH NOSTRILS DAILY AS NEEDED (ALLERGIES). 16 mL 5    Hydrocortisone 2.5 % KIT Place 1 Application rectally as directed. (Patient not taking: Reported on 09/03/2023)      metroNIDAZOLE  (METROGEL ) 0.75 % vaginal gel Place 1 Applicatorful vaginally at bedtime. Apply one applicatorful to vagina at bedtime for 5 days (Patient not taking: Reported on 09/04/2023) 70 g 1    triamcinolone  cream (KENALOG )  0.5 % Apply 1 Application topically 2 (two) times daily. (Patient not taking: Reported on 09/03/2023)      VENTOLIN  HFA 108 (90 Base) MCG/ACT inhaler Inhale 2 puffs into the lungs every 6 (six) hours as needed.       Review of Systems  Constitutional:  Negative for chills, fatigue and fever.  Eyes:  Negative for pain and visual disturbance.  Respiratory:  Negative for apnea, shortness of breath and wheezing.   Cardiovascular:  Negative for chest pain and palpitations.  Gastrointestinal:  Positive for abdominal pain. Negative for constipation, diarrhea, nausea and vomiting.  Genitourinary:  Positive for pelvic pain and vaginal pain. Negative for difficulty urinating, dysuria, vaginal bleeding and vaginal discharge.  Musculoskeletal:  Negative for back pain.  Neurological:  Negative for seizures, weakness and headaches.  Psychiatric/Behavioral:   Negative for suicidal ideas.    Physical Exam   Blood pressure 115/73, pulse 85, temperature 98.4 F (36.9 C), resp. rate 18, height 5' 2 (1.575 m), weight 66.2 kg, last menstrual period 03/27/2023.  Physical Exam Vitals and nursing note reviewed.  Constitutional:      General: She is not in acute distress.    Appearance: Normal appearance.  HENT:     Head: Normocephalic.  Cardiovascular:     Rate and Rhythm: Normal rate and regular rhythm.  Pulmonary:     Effort: Pulmonary effort is normal.  Abdominal:     Palpations: Abdomen is soft.     Tenderness: There is no abdominal tenderness.  Musculoskeletal:        General: No swelling.     Cervical back: Normal range of motion.  Skin:    General: Skin is warm and dry.  Neurological:     Mental Status: She is alert and oriented to person, place, and time.  Psychiatric:        Mood and Affect: Mood normal.    FHT obtained in triage.   MAU Course  Procedures Orders Placed This Encounter  Procedures   Wet prep, genital   Urinalysis, Routine w reflex microscopic -Urine, Clean Catch   Apply heat to affected area   Meds ordered this encounter  Medications   acetaminophen  (TYLENOL ) tablet 1,000 mg    MDM - Wet prep, GC and UA ordered to r/o infection.  - Wet prep normal  - UA normal.  - GC pending upon discharge.  - Pain and pressure relieved with Heat and Tylenol  - plan for discharge   Assessment and Plan   1. Supervision of other normal pregnancy, antepartum   2. Round ligament pain   3. Abdominal pressure   4. Physically well but worried   5. [redacted] weeks gestation of pregnancy    - Reviewed pain likely related to Round Ligament pain, a normal discomfort of pregnancy. - Discussed comfort measures like a maternity support band and KT tape.  - Reviewed worsening signs and return precautions.  - Patient discharged home in stable condition and may return to MAU as needed.   Claris CHRISTELLA Cedar, MSN CNM 09/19/2023, 8:31 AM

## 2023-09-19 NOTE — Discharge Instructions (Signed)

## 2023-09-20 LAB — GC/CHLAMYDIA PROBE AMP (~~LOC~~) NOT AT ARMC
Chlamydia: NEGATIVE
Comment: NEGATIVE
Comment: NORMAL
Neisseria Gonorrhea: NEGATIVE

## 2023-09-21 ENCOUNTER — Ambulatory Visit: Payer: Self-pay | Admitting: Family Medicine

## 2023-10-02 ENCOUNTER — Encounter: Payer: Self-pay | Admitting: Advanced Practice Midwife

## 2023-10-04 ENCOUNTER — Encounter: Payer: Self-pay | Admitting: Obstetrics and Gynecology

## 2023-10-04 ENCOUNTER — Ambulatory Visit (INDEPENDENT_AMBULATORY_CARE_PROVIDER_SITE_OTHER): Admitting: Obstetrics and Gynecology

## 2023-10-04 VITALS — BP 123/69 | HR 79 | Wt 149.4 lb

## 2023-10-04 DIAGNOSIS — Z3A23 23 weeks gestation of pregnancy: Secondary | ICD-10-CM

## 2023-10-04 DIAGNOSIS — Z3480 Encounter for supervision of other normal pregnancy, unspecified trimester: Secondary | ICD-10-CM

## 2023-10-04 NOTE — Progress Notes (Unsigned)
 ROB. Pt states everything is going well.

## 2023-10-04 NOTE — Progress Notes (Unsigned)
   PRENATAL VISIT NOTE  Subjective:  Hannah Harrison is a 18 y.o. G1P0000 at [redacted]w[redacted]d being seen today for ongoing prenatal care.  She is currently monitored for the following issues for this low-risk pregnancy and has PCOS (polycystic ovarian syndrome); Supervision of other normal pregnancy, antepartum; and History of suicidal ideation (2020) on their problem list.  Patient reports back pain, leg pain, need note for work today, started leaking breast  .  Contractions: Not present. Vag. Bleeding: None.  Movement: Increased. Denies leaking of fluid.   The following portions of the patient's history were reviewed and updated as appropriate: allergies, current medications, past family history, past medical history, past social history, past surgical history and problem list.   Objective:    Vitals:   10/04/23 0914  BP: 123/69  Pulse: 79  Weight: 149 lb 6.4 oz (67.8 kg)    Fetal Status:  Fetal Heart Rate (bpm): 150 Fundal Height: 23 cm Movement: Increased    General: Alert, oriented and cooperative. Patient is in no acute distress.  Skin: Skin is warm and dry. No rash noted.   Cardiovascular: Normal heart rate noted  Respiratory: Normal respiratory effort, no problems with respiration noted  Abdomen: Soft, gravid, appropriate for gestational age.  Pain/Pressure: Absent     Pelvic: Cervical exam deferred        Extremities: Normal range of motion.  Edema: None  Mental Status: Normal mood and affect. Normal behavior. Normal judgment and thought content.   Assessment and Plan:  Pregnancy: G1P0000 at [redacted]w[redacted]d 1. Supervision of other normal pregnancy, antepartum (Primary) BP and FHR normal Doing well, feeling regular movement    2. [redacted] weeks gestation of pregnancy -Discussed supportive measures for back pain -Discussed leaking breast, can collect, but avoid stimulating or pumping at this time -Anticipatory guidance regarding GTT and labs next visit, discussed NPO status after midnight      Preterm labor symptoms and general obstetric precautions including but not limited to vaginal bleeding, contractions, leaking of fluid and fetal movement were reviewed in detail with the patient. Please refer to After Visit Summary for other counseling recommendations.   Return in about 4 weeks (around 11/01/2023) for OB VISIT (MD or APP), 2 hr GTT.   Nidia Daring, FNP

## 2023-10-05 ENCOUNTER — Encounter: Payer: Self-pay | Admitting: Physician Assistant

## 2023-10-09 ENCOUNTER — Encounter: Payer: Self-pay | Admitting: Physician Assistant

## 2023-10-11 ENCOUNTER — Telehealth: Admitting: Obstetrics and Gynecology

## 2023-10-25 ENCOUNTER — Encounter: Payer: Self-pay | Admitting: Physician Assistant

## 2023-10-31 ENCOUNTER — Encounter: Payer: Self-pay | Admitting: Physician Assistant

## 2023-11-01 ENCOUNTER — Ambulatory Visit

## 2023-11-01 ENCOUNTER — Other Ambulatory Visit

## 2023-11-01 ENCOUNTER — Ambulatory Visit: Admitting: Obstetrics and Gynecology

## 2023-11-01 ENCOUNTER — Encounter: Payer: Self-pay | Admitting: Obstetrics and Gynecology

## 2023-11-01 ENCOUNTER — Encounter: Payer: Self-pay | Admitting: Physician Assistant

## 2023-11-01 VITALS — BP 127/74 | HR 85 | Wt 159.0 lb

## 2023-11-01 DIAGNOSIS — Z3480 Encounter for supervision of other normal pregnancy, unspecified trimester: Secondary | ICD-10-CM

## 2023-11-01 DIAGNOSIS — Z23 Encounter for immunization: Secondary | ICD-10-CM | POA: Diagnosis not present

## 2023-11-01 DIAGNOSIS — Z3A27 27 weeks gestation of pregnancy: Secondary | ICD-10-CM

## 2023-11-01 DIAGNOSIS — Z8659 Personal history of other mental and behavioral disorders: Secondary | ICD-10-CM | POA: Diagnosis not present

## 2023-11-01 NOTE — Addendum Note (Signed)
 Addended by: JERRYE AREA D on: 11/01/2023 10:09 AM   Modules accepted: Orders

## 2023-11-01 NOTE — Patient Instructions (Signed)
 Childbirth Education Options: [facility] Department Classes:  Childbirth education classes can help you get ready for a positive parenting experience. You can also meet other expectant parents and get free stuff for your baby. Each class runs for five weeks on the same night and costs $45 for the mother-to-be and her support person. Medicaid covers the cost if you are eligible. Call [phone] to register. [facility] Childbirth Education: Classes can vary in availability and schedule is subject to change. For most up-to-date information please visit [email or web address] to review and register.

## 2023-11-01 NOTE — Progress Notes (Signed)
   PRENATAL VISIT NOTE  Subjective:  Hannah Harrison is a 18 y.o. G1P0000 at [redacted]w[redacted]d being seen today for ongoing prenatal care.  She is currently monitored for the following issues for this low-risk pregnancy and has PCOS (polycystic ovarian syndrome); Supervision of other normal pregnancy, antepartum; and History of suicidal ideation (2020) on their problem list.  Patient reports minor discomforts like back pain, joint pain and achiness in pelvis. Contractions: Not present. Vag. Bleeding: None.  Movement: Present. Denies leaking of fluid.   The following portions of the patient's history were reviewed and updated as appropriate: allergies, current medications, past family history, past medical history, past social history, past surgical history and problem list.   Objective:   Vitals:   11/01/23 0943  BP: 127/74  Pulse: 85  Weight: 159 lb (72.1 kg)   Body mass index is 29.08 kg/m. Total weight gain: 49 lb (22.2 kg)   Fetal Status: Fetal Heart Rate (bpm): 143 Fundal Height: 27 cm Movement: Present     General:  Alert, oriented and cooperative. Patient is in no acute distress.  Skin: Skin is warm and dry. No rash noted.   Cardiovascular: Normal heart rate noted  Respiratory: Normal respiratory effort, no problems with respiration noted  Abdomen: Soft, gravid, appropriate for gestational age.  Pain/Pressure: Present     Pelvic: Cervical exam deferred        Extremities: Normal range of motion.     Mental Status: Normal mood and affect. Normal behavior. Normal judgment and thought content.   Assessment and Plan:  Pregnancy: G1P0000 at [redacted]w[redacted]d 1. Supervision of other normal pregnancy, antepartum (Primary) Fetal kick counts, classes, breast pumps & breastfeeding reviewed Tdap today Third trimester labs today  2. History of suicidal ideation (2020) Reports mood is good, feeling very well this pregnancy emotionally. Reviewed signs/symptoms of peripartum depression/anxiety and  availability of behavioral health prn    Preterm labor symptoms and general obstetric precautions including but not limited to vaginal bleeding, contractions, leaking of fluid and fetal movement were reviewed in detail with the patient. Please refer to After Visit Summary for other counseling recommendations.   No follow-ups on file.  No future appointments.  Rollo ONEIDA Bring, MD

## 2023-11-02 LAB — CBC
Hematocrit: 36.5 % (ref 34.0–46.6)
Hemoglobin: 12.1 g/dL (ref 11.1–15.9)
MCH: 32.4 pg (ref 26.6–33.0)
MCHC: 33.2 g/dL (ref 31.5–35.7)
MCV: 98 fL — ABNORMAL HIGH (ref 79–97)
Platelets: 222 x10E3/uL (ref 150–450)
RBC: 3.74 x10E6/uL — ABNORMAL LOW (ref 3.77–5.28)
RDW: 12.2 % (ref 11.7–15.4)
WBC: 8.8 x10E3/uL (ref 3.4–10.8)

## 2023-11-02 LAB — GLUCOSE TOLERANCE, 2 HOURS W/ 1HR
Glucose, 1 hour: 94 mg/dL (ref 70–179)
Glucose, 2 hour: 90 mg/dL (ref 70–152)
Glucose, Fasting: 72 mg/dL (ref 70–91)

## 2023-11-02 LAB — RPR: RPR Ser Ql: NONREACTIVE

## 2023-11-02 LAB — HIV ANTIBODY (ROUTINE TESTING W REFLEX): HIV Screen 4th Generation wRfx: NONREACTIVE

## 2023-11-05 ENCOUNTER — Encounter: Payer: Self-pay | Admitting: Obstetrics and Gynecology

## 2023-11-06 ENCOUNTER — Ambulatory Visit: Payer: Self-pay | Admitting: Physician Assistant

## 2023-11-06 ENCOUNTER — Encounter: Admitting: Obstetrics and Gynecology

## 2023-11-07 ENCOUNTER — Encounter: Payer: Self-pay | Admitting: Obstetrics and Gynecology

## 2023-11-09 ENCOUNTER — Encounter: Payer: Self-pay | Admitting: Advanced Practice Midwife

## 2023-11-16 ENCOUNTER — Encounter: Payer: Self-pay | Admitting: Family Medicine

## 2023-11-16 ENCOUNTER — Ambulatory Visit (INDEPENDENT_AMBULATORY_CARE_PROVIDER_SITE_OTHER): Admitting: Family Medicine

## 2023-11-16 VITALS — BP 128/79 | HR 90 | Wt 162.2 lb

## 2023-11-16 DIAGNOSIS — Z3480 Encounter for supervision of other normal pregnancy, unspecified trimester: Secondary | ICD-10-CM | POA: Diagnosis not present

## 2023-11-16 DIAGNOSIS — E282 Polycystic ovarian syndrome: Secondary | ICD-10-CM

## 2023-11-16 DIAGNOSIS — Z8659 Personal history of other mental and behavioral disorders: Secondary | ICD-10-CM

## 2023-11-16 DIAGNOSIS — Z3A29 29 weeks gestation of pregnancy: Secondary | ICD-10-CM | POA: Diagnosis not present

## 2023-11-16 NOTE — Progress Notes (Signed)
   PRENATAL VISIT NOTE  Subjective:  Hannah Harrison is a 18 y.o. G1P0000 at [redacted]w[redacted]d being seen today for ongoing prenatal care.  She is currently monitored for the following issues for this low-risk pregnancy and has PCOS (polycystic ovarian syndrome); Supervision of other normal pregnancy, antepartum; and History of suicidal ideation (2020) on their problem list.  Patient reports backache and increased pelvic pressure.  Contractions: Irritability. Vag. Bleeding: None.  Movement: Increased. Denies leaking of fluid.   The following portions of the patient's history were reviewed and updated as appropriate: allergies, current medications, past family history, past medical history, past social history, past surgical history and problem list.   Objective:    Vitals:   11/16/23 1109  BP: 128/79  Pulse: 90  Weight: 162 lb 3.2 oz (73.6 kg)    Fetal Status:  Fetal Heart Rate (bpm): 147   Movement: Increased    General: Alert, oriented and cooperative. Patient is in no acute distress.  Skin: Skin is warm and dry. No rash noted.   Cardiovascular: Normal heart rate noted  Respiratory: Normal respiratory effort, no problems with respiration noted  Abdomen: Soft, gravid, appropriate for gestational age.  Pain/Pressure: Present     Pelvic: Cervical exam deferred        Extremities: Normal range of motion.  Edema: None  Mental Status: Normal mood and affect. Normal behavior. Normal judgment and thought content.   Assessment and Plan:  Pregnancy: G1P0000 at [redacted]w[redacted]d 1. Supervision of other normal pregnancy, antepartum (Primary) Prenatal course reviewed BP, HR, FHR within normal limits Feeling regular FM   2. PCOS (polycystic ovarian syndrome)  3. History of suicidal ideation (2020) Mood doing well, has a good support system.  4. [redacted] weeks gestation of pregnancy Reviewed 28 week labs, all within normal limits. Passed GTT. Recommend belly band, Tylenol , stretches, prenatal yoga for pelvic  pressure and back pain.  Preterm labor symptoms and general obstetric precautions including but not limited to vaginal bleeding, contractions, leaking of fluid and fetal movement were reviewed in detail with the patient. Please refer to After Visit Summary for other counseling recommendations.   Return in about 2 weeks (around 11/30/2023) for LOB.  No future appointments.  Joesph DELENA Sear, PA

## 2023-11-16 NOTE — Patient Instructions (Signed)
 PELVIC/ABDOMINAL PAIN: Up to 80 percent of women experience groin pain at some point during pregnancy, mostly in that final trimester when stress on the pelvic region is especially intense. Your increasingly heavy baby is burrowing deeper into your pelvis in preparation for birth, and their head is now pressing hard against your bladder, rectum, hips and pelvic bones. The result is an ever-increasing stress on the joints, muscles and organs in your pelvis and back. Soak in a warm tub (not too hot) Tylenol  1000 mg by mouth every 6-8 hrs as needed for pain Slow position changes Laying on the affected side Wear a maternity support belt during the day. Take it off while sleeping. Apply a heating pad to your lower back for 20 minutes at a time, taking at least a 20-minute break before applying again. Massage: Starting at the middle of your pubic bone, trace little circles in a wide U from your pubic bone to your hip bones on both sides.  Then starting just above your pubic bone, press in and down, alternating sides to create a gentle rocking of your uterus back and forth.  Move your hands up the sides of your belly and back down. Do this 3-5 times upon waking and before bed.  Stretches: Get on hands and knees and alternate arching your back deeply while inhaling, and then rounding your back while exhaling. Modified runners lunge:  Sit on a chair with half of your bottom on the chair and half off.  Sit up tall, plant your front foot, and stretch your other foot out behind you.  Breathe deeply for 5 breaths and then do the other side. Chiropractor or massage therapy: [facility] in [place] specializes in pregnancy care. You can visit their website at: [email or web address] to schedule a new patient chiropractic treatment (1 hour) appointment. Please let us  know if you have any other questions or concerns.   BACK PAIN: Rest Use ice/heat/warm bath Increase PO fluids,  aim for 80-100 ounces of fluid intake each day Tylenol  is safe to take for pain. Do not take more than 4000 mg total in 24 hours. Pregnancy support belt Pregnancy Yoga: There are free options through YouTube and you can purchase DVDs easily.  Float therapy: This involves soaking in a warm bath of magnesium to help relax muscles. Local places that have this option are [facility] in [place] or [facility] in [facility] Massage therapy: This is safe in pregnancy. Just assure your therapist is trained in prenatal massage. Some local options include [employer] and [facility] Chiropractic care: This involves re-aligning the bones and muscles of the body. You want to make sure the practitioner has training in pregnancy ([employer] Method).  A local option is [facility] at [employer] and [employer]. [phone] [email or web address] and there are many other chiropractic offices that do adjustments in pregnancy  BACK PAIN EXERCISES Stomach tone  Lie on your front with your arms by your side, head on one side. Pull in your stomach muscles, centered around your belly button. Hold for five seconds. Repeat three times. Build up to 10 seconds and repeat during the day, while walking or standing. Keep breathing during this exercise!  Pelvic tilt  Lie down with your knees bent. Tighten your stomach muscles, flattening your back against the floor. Hold for five seconds. Repeat five times.  Knee rolls  Lie on your back with your knees bent and your feet together. Roll your knees to one  side, keeping your shoulders flat on the bed or floor, and hold for 10 seconds. Roll your knees back to the starting position, and then over to the other side and repeat. Do this exercise three times on each side.  Knees to chest  Lie on your back, with your knees bent and feet flat on the floor or bed. Bring one knee up and use your hands to pull it  gently towards your chest. Hold the leg in position for five seconds, and then relax. Repeat this exercise with the other knee. Do the exercise five times on each side.  Buttock tone  Lie on your front and bend one leg up behind you. Lift your bent knee just off the floor. Hold for up to eight seconds. Repeat five times each side.   Deep stomach muscle tone  Kneel on all fours with a small curve in your lower back. Let your stomach relax completely. Pull the lower part of your stomach upwards so that you lift your back (without arching it) away from the floor. Hold for 10 seconds. Keep breathing! Repeat 10 times.  Back stabilizer  Kneel on all fours with your back straight. Tighten your stomach. Keeping your back in this position, raise one arm in front of you and hold for 10 seconds. Try to keep your pelvis level and don't rotate your body. Repeat 10 times each side. To progress, try lifting one leg behind you instead of raising your arm.  Leg raise  Lie face down, though you might want to turn your head to one side if this is more comfortable. Tighten your stomach and buttock muscles to lift one leg slightly off the floor, while keeping your hips flat on the ground. Hold this position for 5 to 10 seconds and repeat 3 times.   Arm raise  Lie on your stomach with your back in a neutral position. Tense the muscles in your lower stomach and raise one arm upwards. Hold this position for five seconds, and then relax your arm. Repeat this exercise 10 times with each arm.  Hamstring stretch  Steady yourself, then put one leg up on a chair. Keeping your raised leg straight, bend the supporting knee forward to stretch your hamstrings. Repeat three times each side. Please note: For those with acute sciatica this hamstring stretch may also pull on the sciatic nerve, making it feel worse. If in doubt, ask a physiotherapist if this exercise is suitable for you.  One-leg stand  Steady yourself  with one hand on a wall or work surface for support. Bend one leg up behind you. Hold your foot for 10 seconds and repeat three times each side. Try to keep your knees and thighs level with one another.  Deep lunge  Kneel on one knee, the other foot in front. Lift your back knee up, making sure you keep looking forwards. Push your hips forward. Hold for five seconds and repeat three times each side. Try to keep your upper body upright, avoid bending or leaning your upper body forwards.

## 2023-11-16 NOTE — Progress Notes (Signed)
 ROB. No concerns at this time.

## 2023-11-26 ENCOUNTER — Encounter: Payer: Self-pay | Admitting: Obstetrics and Gynecology

## 2023-11-27 ENCOUNTER — Encounter: Payer: Self-pay | Admitting: Obstetrics and Gynecology

## 2023-11-29 ENCOUNTER — Ambulatory Visit: Admitting: Obstetrics and Gynecology

## 2023-11-29 VITALS — BP 129/78 | HR 90 | Wt 162.9 lb

## 2023-11-29 DIAGNOSIS — Z3A31 31 weeks gestation of pregnancy: Secondary | ICD-10-CM | POA: Diagnosis not present

## 2023-11-29 DIAGNOSIS — Z3480 Encounter for supervision of other normal pregnancy, unspecified trimester: Secondary | ICD-10-CM | POA: Diagnosis not present

## 2023-11-29 NOTE — Progress Notes (Signed)
 ROB in office, reports fetal movement with pressure and occasional contractions.

## 2023-11-29 NOTE — Progress Notes (Signed)
   PRENATAL VISIT NOTE  Subjective:  Hannah Harrison is a 18 y.o. G1P0000 at [redacted]w[redacted]d being seen today for ongoing prenatal care.  She is currently monitored for the following issues for this low-risk pregnancy and has PCOS (polycystic ovarian syndrome); Supervision of other normal pregnancy, antepartum; and History of suicidal ideation (2020) on their problem list.  Patient doing well with no acute concerns today. She reports occasional contractions.  Contractions: Irritability. Vag. Bleeding: None.  Movement: Present. Denies leaking of fluid.   The following portions of the patient's history were reviewed and updated as appropriate: allergies, current medications, past family history, past medical history, past social history, past surgical history and problem list. Problem list updated.  Objective:   Vitals:   11/29/23 1447  BP: 129/78  Pulse: 90  Weight: 162 lb 14.4 oz (73.9 kg)    Fetal Status: Fetal Heart Rate (bpm): 143 Fundal Height: 31 cm Movement: Present     General:  Alert, oriented and cooperative. Patient is in no acute distress.  Skin: Skin is warm and dry. No rash noted.   Cardiovascular: Normal heart rate noted  Respiratory: Normal respiratory effort, no problems with respiration noted  Abdomen: Soft, gravid, appropriate for gestational age.  Pain/Pressure: Present     Pelvic: Cervical exam deferred        Extremities: Normal range of motion.  Edema: None  Mental Status:  Normal mood and affect. Normal behavior. Normal judgment and thought content.   Assessment and Plan:  Pregnancy: G1P0000 at [redacted]w[redacted]d  1. Supervision of other normal pregnancy, antepartum (Primary) Continue routine prenatal care Occ ctx noted, pt advised to increase hydration 6-10 glasses/day If ctx intensify, advised to seek care at [facility]  2. [redacted] weeks gestation of pregnancy   Preterm labor symptoms and general obstetric precautions including but not limited to vaginal bleeding, contractions,  leaking of fluid and fetal movement were reviewed in detail with the patient.  Please refer to After Visit Summary for other counseling recommendations.   Return in about 2 weeks (around 12/13/2023) for ROB, in person.   Jerilynn Buddle, MD [facility] Attending [facility]

## 2023-11-30 ENCOUNTER — Encounter: Payer: Self-pay | Admitting: Obstetrics and Gynecology

## 2023-12-13 ENCOUNTER — Encounter: Payer: Self-pay | Admitting: Student

## 2023-12-13 ENCOUNTER — Ambulatory Visit: Admitting: Student

## 2023-12-13 VITALS — BP 127/71 | HR 86 | Wt 169.1 lb

## 2023-12-13 DIAGNOSIS — Z3480 Encounter for supervision of other normal pregnancy, unspecified trimester: Secondary | ICD-10-CM

## 2023-12-13 DIAGNOSIS — Z3A33 33 weeks gestation of pregnancy: Secondary | ICD-10-CM

## 2023-12-13 DIAGNOSIS — O99891 Other specified diseases and conditions complicating pregnancy: Secondary | ICD-10-CM

## 2023-12-13 DIAGNOSIS — M549 Dorsalgia, unspecified: Secondary | ICD-10-CM

## 2023-12-13 NOTE — Progress Notes (Signed)
 No concerns today

## 2023-12-13 NOTE — Progress Notes (Signed)
   PRENATAL VISIT NOTE  Subjective:  Hannah Harrison is a 18 y.o. G1P0000 at [redacted]w[redacted]d being seen today for ongoing prenatal care.  She is currently monitored for the following issues for this low-risk pregnancy and has PCOS (polycystic ovarian syndrome); Supervision of other normal pregnancy, antepartum; and History of suicidal ideation (2020) on their problem list.  Patient reports backache.  Contractions: Irritability. Vag. Bleeding: None.  Movement: Increased. Denies leaking of fluid.   The following portions of the patient's history were reviewed and updated as appropriate: allergies, current medications, past family history, past medical history, past social history, past surgical history and problem list.   Objective:    Vitals:   12/13/23 1557  BP: 127/71  Pulse: 86  Weight: 169 lb 1.6 oz (76.7 kg)    Fetal Status:  Fetal Heart Rate (bpm): 134   Movement: Increased    General: Alert, oriented and cooperative. Patient is in no acute distress.  Skin: Skin is warm and dry. No rash noted.   Cardiovascular: Normal heart rate noted  Respiratory: Normal respiratory effort, no problems with respiration noted  Abdomen: Soft, gravid, appropriate for gestational age.  Pain/Pressure: Present     Pelvic: Cervical exam deferred        Extremities: Normal range of motion.  Edema: None  Mental Status: Normal mood and affect. Normal behavior. Normal judgment and thought content.   Assessment and Plan:  Pregnancy: G1P0000 at [redacted]w[redacted]d 1. Supervision of other normal pregnancy, antepartum (Primary) - Frequent fetal movement  2. [redacted] weeks gestation of pregnancy - Continue routine follow-up  3. 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 Please  refer to After Visit Summary for other counseling recommendations.   No follow-ups on file.  Future Appointments  Date Time Provider Department Center  12/27/2023 11:15 AM Letha Renshaw, CNM [facility] None    Nat Dauer, NP

## 2023-12-17 ENCOUNTER — Encounter (HOSPITAL_COMMUNITY): Payer: Self-pay | Admitting: Obstetrics & Gynecology

## 2023-12-17 ENCOUNTER — Other Ambulatory Visit: Payer: Self-pay

## 2023-12-17 ENCOUNTER — Inpatient Hospital Stay (HOSPITAL_COMMUNITY)
Admission: AD | Admit: 2023-12-17 | Discharge: 2023-12-17 | Disposition: A | Discharge status: Home / Self Care | Attending: Obstetrics & Gynecology | Admitting: Obstetrics & Gynecology

## 2023-12-17 DIAGNOSIS — Z3480 Encounter for supervision of other normal pregnancy, unspecified trimester: Secondary | ICD-10-CM

## 2023-12-17 DIAGNOSIS — B9689 Other specified bacterial agents as the cause of diseases classified elsewhere: Secondary | ICD-10-CM | POA: Diagnosis not present

## 2023-12-17 DIAGNOSIS — O23593 Infection of other part of genital tract in pregnancy, third trimester: Secondary | ICD-10-CM | POA: Insufficient documentation

## 2023-12-17 DIAGNOSIS — Z3A33 33 weeks gestation of pregnancy: Secondary | ICD-10-CM | POA: Diagnosis not present

## 2023-12-17 DIAGNOSIS — O479 False labor, unspecified: Secondary | ICD-10-CM

## 2023-12-17 DIAGNOSIS — O4703 False labor before 37 completed weeks of gestation, third trimester: Secondary | ICD-10-CM | POA: Insufficient documentation

## 2023-12-17 DIAGNOSIS — N760 Acute vaginitis: Secondary | ICD-10-CM

## 2023-12-17 LAB — URINALYSIS, ROUTINE W REFLEX MICROSCOPIC
Bilirubin Urine: NEGATIVE
Glucose, UA: NEGATIVE mg/dL
Hgb urine dipstick: NEGATIVE
Ketones, ur: NEGATIVE mg/dL
Leukocytes,Ua: NEGATIVE
Nitrite: NEGATIVE
Protein, ur: NEGATIVE mg/dL
Specific Gravity, Urine: 1.006 (ref 1.005–1.030)
pH: 7 (ref 5.0–8.0)

## 2023-12-17 LAB — WET PREP, GENITAL
Sperm: NONE SEEN
Trich, Wet Prep: NONE SEEN
WBC, Wet Prep HPF POC: >=10 — AB (ref <10)
Yeast Wet Prep HPF POC: NONE SEEN

## 2023-12-17 MED ORDER — METRONIDAZOLE 500 MG PO TABS: 500.0000 mg | ORAL_TABLET | Freq: Two times a day (BID) | ORAL | 0 refills | Status: DC

## 2023-12-17 NOTE — MAU Provider Note (Cosign Needed Addendum)
 Chief Complaint:  Contractions   Event Date/Time   First Provider Initiated Contact with Patient 12/17/23 1242      HPI: Hannah Harrison is a 18 y.o. G1P0000 at [redacted]w[redacted]d by early ultrasound who presents to maternity admissions reporting regular contractions x6h and vaginal pain and pressure. She had one prior episodes of contraction-type pain 3 weeks ago that resolved on its own without evaluation. She denies any recent trauma, abdominal pain or vaginal bleeding or discharge. She reports the contractions have improved slightly since she has been resting vs earlier this morning when she was working. She came for evaluation for concern of premature labor, though she denies loss of fluid. She does endorse pressure low in her abdomen and pain in her vagina that came on recently w/o concern for STI. Last intercourse on Saturday.   She reports good fetal movement, denies LOF, vaginal itching/burning, urinary symptoms, h/a, dizziness, n/v, or fever/chills.    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   Asthma    Eczema    Intentional drug overdose (HCC) 02/28/2019   Intentional selective serotonin reuptake inhibitor overdose (HCC) 02/28/2019   Primary oligomenorrhea 01/06/2021   Scoliosis     Past obstetric history: OB History  Gravida Para Term Preterm AB Living  1 0 0 0 0 0  SAB IAB Ectopic Multiple Live Births  0 0 0 0 0    # Outcome Date GA Lbr Len/2nd Weight Sex Type Anes PTL Lv  1 Current             Past Surgical History: Past Surgical History:  Procedure Laterality Date   NO PAST SURGERIES      Family History: Family History  Problem Relation Age of Onset   Hyperlipidemia Maternal Grandmother     Social History: Social History   Tobacco Use   Smoking status: Former    Types: Cigars   Smokeless tobacco: Never  Vaping Use   Vaping status: Never Used  Substance Use Topics   Alcohol  use: Not Currently    Comment: Occ   Drug use: Not Currently    Types: Marijuana    Comment: Occassionally.    Allergies:  Allergies  Allergen Reactions   Other     Seasonal allergies    Peanuts [Peanut Oil] Other (See Comments)    Throat swelling   Shellfish Allergy     Hives and throat swelling    Meds:  Medications Prior to Admission  Medication Sig Dispense Refill Last Dose/Taking   Prenatal 28-0.8 MG TABS Take 1 tablet by mouth daily. 30 tablet 12 12/17/2023   Blood Pressure Monitoring (BLOOD PRESSURE KIT) DEVI 1 kit by Does not apply route once a week. Check Blood Pressure regularly and record readings into the Babyscripts App.  Large Cuff.  DX O90.0 (Patient not taking: Reported on 11/29/2023) 1 each 0    Blood Pressure Monitoring (BLOOD PRESSURE KIT) DEVI 1 Device by Does not apply route once a week. (Patient not taking: Reported on 11/29/2023) 1 each 0    fluticasone  (FLONASE ) 50 MCG/ACT nasal spray PLACE 1 SPRAY INTO BOTH NOSTRILS DAILY AS NEEDED (ALLERGIES). 16 mL 5 More than a month    I have reviewed patient's Past Medical Hx, Surgical Hx, Family Hx, Social Hx, medications and allergies.   Physical Exam  Patient Vitals for the past 24 hrs:  BP Temp Temp src Pulse Resp Height Weight  12/17/23  1336 133/75 -- -- 89 -- -- --  12/17/23 1205 126/81 98.9 F (37.2 C) Oral 94 14 5' 2 (1.575 m) 75.3 kg   Constitutional: Well-developed, well-nourished female in no acute distress.  Cardiovascular: normal rate Respiratory: normal effort GI: Abd soft, non-tender, gravid appropriate for gestational age.  MS: Extremities nontender, no edema, normal ROM Neurologic: Alert and oriented x 4.  GU: Neg CVAT. Pelvic exam Chaperoned by Josette Pollack RN Dilation: Closed Exam by:: Eliz Nigg NP  FHT:  Baseline 125, moderate variability, accelerations present, no decelerations Contractions: q 3-5 mins   Labs: Results for orders placed or performed during the hospital encounter of  12/17/23 (from the past 24 hours)  Urinalysis, Routine w reflex microscopic -Urine, Clean Catch     Status: Abnormal   Collection Time: 12/17/23 12:45 PM  Result Value Ref Range   Color, Urine STRAW (A) YELLOW   APPearance CLEAR CLEAR   Specific Gravity, Urine 1.006 1.005 - 1.030   pH 7.0 5.0 - 8.0   Glucose, UA NEGATIVE NEGATIVE mg/dL   Hgb urine dipstick NEGATIVE NEGATIVE   Bilirubin Urine NEGATIVE NEGATIVE   Ketones, ur NEGATIVE NEGATIVE mg/dL   Protein, ur NEGATIVE NEGATIVE mg/dL   Nitrite NEGATIVE NEGATIVE   Leukocytes,Ua NEGATIVE NEGATIVE  Wet prep, genital     Status: Abnormal   Collection Time: 12/17/23 12:53 PM   Specimen: Urine, Clean Catch  Result Value Ref Range   Yeast Wet Prep HPF POC NONE SEEN NONE SEEN   Trich, Wet Prep NONE SEEN NONE SEEN   Clue Cells Wet Prep HPF POC PRESENT (A) NONE SEEN   WBC, Wet Prep HPF POC >=10 (A) <10   Sperm NONE SEEN    AB/Positive/-- (04/22 1411)  Imaging:  No results found.  MAU Course/MDM: Orders Placed This Encounter  Procedures   Wet prep, genital   Urinalysis, Routine w reflex microscopic -Urine, Clean Catch   Discharge patient Discharge disposition: 01-Home or Self Care; Discharge patient date: 12/17/2023    Meds ordered this encounter  Medications   metroNIDAZOLE  (FLAGYL ) 500 MG tablet    Sig: Take 1 tablet (500 mg total) by mouth 2 (two) times daily.    Dispense:  14 tablet    Refill:  0    Supervising Provider:   PRATT, TANYA S [[id number]]   Wet prep showing clue cells -> Flagyl  rx x 1 week UA WNL G/c pending, patient discharged w/ callback plan if positive   Cervix closed, thick and high on exam NST reviewed, category 1 tracing w/ baseline 130, moderate variability and accels present Treatments in MAU included flagyl  provided for BV.  Pt discharged with return precautions regarding vaginal bleeding, abdominal pain or non resolving contractions, loss of fluid and fetal movement.  Assessment: 1. Supervision of other  normal pregnancy, antepartum   2. [redacted] weeks gestation of pregnancy   3. Braxton Hick's contraction   4. Bacterial vaginosis     Plan: Discharge home Flagyl  500mg  BID x 7d Labor precautions and fetal kick counts  Follow-up Information     [facility] Follow up.   Specialty: Obstetrics and Gynecology Why: If symptoms worsen or fail to resolve, As scheduled for ongoing prenatal care Contact information: [address], [address] [place]  [address] [phone]               Allergies as of 12/17/2023       Reactions   Other  Seasonal allergies   Peanuts [peanut Oil] Other (See Comments)   Throat swelling   Shellfish Allergy    Hives and throat swelling        Medication List     TAKE these medications    Blood Pressure Kit Devi 1 kit by Does not apply route once a week. Check Blood Pressure regularly and record readings into the Babyscripts App.  Large Cuff.  DX O90.0   Blood Pressure Kit Devi 1 Device by Does not apply route once a week.   fluticasone  50 MCG/ACT nasal spray Commonly known as: FLONASE  PLACE 1 SPRAY INTO BOTH NOSTRILS DAILY AS NEEDED (ALLERGIES).   metroNIDAZOLE  500 MG tablet Commonly known as: FLAGYL  Take 1 tablet (500 mg total) by mouth 2 (two) times daily.   Prenatal 28-0.8 MG Tabs Take 1 tablet by mouth daily.        Leafy Scriver, DO 12/17/2023 1:48 PM   Attestation of Supervision of Student:  I confirm that I have verified the information documented in the Resident Physician's  note and that I have also personally reperformed the history, physical exam and all medical decision making activities.  I have verified that all services and findings are accurately documented in this student's note; and I agree with management and plan as outlined in the documentation. I have also made any necessary editorial changes.  I was personally present and confirmed the HPI,  performed the physical exam and cervical exam, interpreted the NST and agree with the A/P as written above.  Olam DELENA Dalton, NP [facility] [employer], [facility] 12/17/2023 2:09 PM

## 2023-12-17 NOTE — MAU Note (Addendum)
..  Hannah Harrison is a 18 y.o. at [redacted]w[redacted]d here in MAU reporting: around 0600 she started having regular contractions. She reports that they are every 10 minutes. She feels that they are mainly in her back. She reports normal vaginal discharge. Last intercourse was on Saturday. Denies VB, LOF, or urinary s/sx.   Onset of complaint: 0600 Pain score: 7 Vitals:   12/17/23 1205  BP: 126/81  Pulse: 94  Resp: 14  Temp: 98.9 F (37.2 C)     FHT:135 Lab orders placed from triage:   UA

## 2023-12-18 LAB — GC/CHLAMYDIA PROBE AMP (~~LOC~~) NOT AT ARMC
Chlamydia: NEGATIVE
Comment: NEGATIVE
Comment: NORMAL
Neisseria Gonorrhea: NEGATIVE

## 2023-12-19 ENCOUNTER — Encounter: Payer: Self-pay | Admitting: Obstetrics

## 2023-12-21 ENCOUNTER — Ambulatory Visit

## 2023-12-23 ENCOUNTER — Ambulatory Visit: Admission: EM | Admit: 2023-12-23 | Discharge: 2023-12-23 | Disposition: A | Discharge status: Home / Self Care

## 2023-12-23 ENCOUNTER — Encounter: Payer: Self-pay | Admitting: Emergency Medicine

## 2023-12-23 ENCOUNTER — Other Ambulatory Visit: Payer: Self-pay

## 2023-12-23 DIAGNOSIS — J4520 Mild intermittent asthma, uncomplicated: Secondary | ICD-10-CM | POA: Insufficient documentation

## 2023-12-23 DIAGNOSIS — K130 Diseases of lips: Secondary | ICD-10-CM | POA: Diagnosis not present

## 2023-12-23 DIAGNOSIS — L2082 Flexural eczema: Secondary | ICD-10-CM | POA: Insufficient documentation

## 2023-12-23 DIAGNOSIS — B9689 Other specified bacterial agents as the cause of diseases classified elsewhere: Secondary | ICD-10-CM

## 2023-12-23 DIAGNOSIS — M4184 Other forms of scoliosis, thoracic region: Secondary | ICD-10-CM | POA: Insufficient documentation

## 2023-12-23 MED ORDER — HYDROCORTISONE 2.5 % EX LOTN: TOPICAL_LOTION | Freq: Two times a day (BID) | CUTANEOUS | 0 refills | Status: DC

## 2023-12-23 NOTE — ED Triage Notes (Signed)
 Pt sts lesions on side of mouth with some pain; pt is [redacted] weeks pregnant without complaint

## 2023-12-23 NOTE — Discharge Instructions (Addendum)
 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  This is a condition that can come from history of eczema, nutritional deficits, drooling and several other causes.  This normally will resolve with good moisturizing lip balm's and can also try a low-dose hydrocortisone lotion to the area twice daily.  Make sure to stay up on your multivitamins for pregnancy and eat plenty of protein.  We will send the following prescription into your pharmacy: Hydrocortisone 2.5% twice daily to the affected areas. Make sure to apply a good lip balm for moisturizing such as Carmex, Aquaphor or Vaseline Follow-up as needed  Screening swab done today and results will be available in 24-48 hours. We will contact you if we need to arrange additional treatment based on your testing. Negative results will be on your MyChart account.

## 2023-12-23 NOTE — ED Provider Notes (Addendum)
 [name]    CSN: [id number] Arrival date & time: 12/23/23  1424      History   Chief Complaint Chief Complaint  Patient presents with   Mouth Lesions    HPI Hannah Harrison is a 18 y.o. female.   18 year old female who presents urgent care with complaints of cracking at the corners of her mouth.  She is almost [redacted] weeks pregnant.  She reports this has been going on for Several weeks and she wanted to be sure there was nothing wrong.  She has been trying lip balm without much relief.  She also recently had bacterial vaginitis and would like to reswab today.  She is not currently having any symptoms.   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   Asthma    Eczema    Intentional drug overdose (HCC) 02/28/2019   Intentional selective serotonin reuptake inhibitor overdose (HCC) 02/28/2019   Primary oligomenorrhea 01/06/2021   Scoliosis     Patient Active Problem List   Diagnosis Date Noted   Other forms of scoliosis, thoracic region 12/23/2023   Mild intermittent asthma 12/23/2023   Flexural eczema 12/23/2023   History of suicidal ideation (2020) 09/04/2023   Supervision of other normal pregnancy, antepartum 06/14/2023   PCOS (polycystic ovarian syndrome) 01/06/2021    Past Surgical History:  Procedure Laterality Date   NO PAST SURGERIES      OB History     Gravida  1   Para  0   Term  0   Preterm  0   AB  0   Living  0      SAB  0   IAB  0   Ectopic  0   Multiple  0   Live Births  0            Home Medications    Prior to Admission medications   Medication Sig Start Date End Date Taking? Authorizing Provider  albuterol  (VENTOLIN  HFA) 108 (90 Base) MCG/ACT inhaler Inhale 2 puffs into the lungs every 4 (four) hours as needed for wheezing or shortness of breath. 08/25/22   Yes [provider]  hydrocortisone 2.5 % lotion Apply topically 2 (two) times daily. 12/23/23  Yes Garlene Apperson A, PA-C  triamcinolone  cream (KENALOG ) 0.5 % Apply 1 Application topically 2 (two) times daily. 12/07/21  Yes [provider]  Blood Pressure Monitoring (BLOOD PRESSURE KIT) DEVI 1 kit by Does not apply route once a week. Check Blood Pressure regularly and record readings into the Babyscripts App.  Large Cuff.  DX O90.0 Patient not taking: Reported on 11/29/2023 07/16/20   Constant, Peggy, MD  Blood Pressure Monitoring (BLOOD PRESSURE KIT) DEVI 1 Device by Does not apply route once a week. Patient not taking: Reported on 11/29/2023 06/14/23   Zina Jerilynn LABOR, MD  fluticasone  (FLONASE ) 50 MCG/ACT nasal spray PLACE 1 SPRAY INTO BOTH NOSTRILS DAILY AS NEEDED (ALLERGIES). 08/03/23   Rudy Carlin LABOR, MD  metroNIDAZOLE  (FLAGYL ) 500 MG tablet Take 1 tablet (500 mg total) by mouth 2 (two) times daily. 12/17/23   Cooleen, Olam LABOR, NP  Prenatal 28-0.8 MG TABS Take 1 tablet by mouth daily. 06/07/23   Zina Jerilynn LABOR, MD  triamcinolone  acetonide (KENALOG -40) 40 MG/ML injection Inject 40 mg into the muscle once.    [provider]    Family History Family History  Problem Relation Age of Onset   Hyperlipidemia Maternal Grandmother     Social History Social History   Tobacco Use   Smoking status: Former    Types: Cigars   Smokeless tobacco: Never  Vaping Use   Vaping status: Never Used  Substance Use Topics   Alcohol use: Not Currently    Comment: Occ   Drug use: Not Currently    Types: Marijuana    Comment: Occassionally.     Allergies   Other, Peanut-containing drug products, Peanuts [peanut oil], and Shellfish allergy   Review of Systems Review of Systems  Constitutional:  Negative for chills and fever.  HENT:  Positive for mouth sores. Negative for ear pain and sore throat.   Eyes:  Negative for pain and visual disturbance.  Respiratory:   Negative for cough and shortness of breath.   Cardiovascular:  Negative for chest pain and palpitations.  Gastrointestinal:  Negative for abdominal pain and vomiting.  Genitourinary:  Negative for dysuria and hematuria.  Musculoskeletal:  Negative for arthralgias and back pain.  Skin:  Negative for color change and rash.  Neurological:  Negative for seizures and syncope.  All other systems reviewed and are negative.    Physical Exam Triage Vital Signs ED Triage Vitals [12/23/23 1504]  Encounter Vitals Group     BP 131/85     Girls Systolic BP Percentile      Girls Diastolic BP Percentile      Boys Systolic BP Percentile      Boys Diastolic BP Percentile      Pulse Rate 96     Resp 18     Temp 98 F (36.7 C)     Temp Source Oral     SpO2 95 %     Weight      Height      Head Circumference      Peak Flow      Pain Score 1     Pain Loc      Pain Education      Exclude from Growth Chart    No data found.  Updated Vital Signs BP 131/85 (BP Location: Left Arm)   Pulse 96   Temp 98 F (36.7 C) (Oral)   Resp 18   LMP 03/27/2023 (Exact Date)   SpO2 95%   Visual Acuity Right Eye Distance:   Left Eye Distance:   Bilateral Distance:    Right Eye Near:   Left Eye Near:    Bilateral Near:     Physical Exam Vitals and nursing note reviewed.  Constitutional:      General: She is not in acute distress.    Appearance: She is well-developed.  HENT:     Head: Normocephalic and atraumatic.     Mouth/Throat:      Comments: Areas indicated above with linear cracks consistent with angular cheilitis Eyes:     Conjunctiva/sclera: Conjunctivae normal.  Cardiovascular:     Rate and Rhythm: Normal rate and regular rhythm.     Heart sounds: No murmur heard. Pulmonary:     Effort: Pulmonary effort is normal. No respiratory distress.  Musculoskeletal:        General: No swelling.     Cervical back: Neck supple.  Skin:    General: Skin is warm and dry.     Capillary  Refill: Capillary refill takes less than 2 seconds.  Neurological:     Mental Status: She  is alert.  Psychiatric:        Mood and Affect: Mood normal.      UC Treatments / Results  Labs (all labs ordered are listed, but only abnormal results are displayed) Labs Reviewed  CERVICOVAGINAL ANCILLARY ONLY    EKG   Radiology No results found.  Procedures Procedures (including critical care time)  Medications Ordered in UC Medications - No data to display  Initial Impression / Assessment and Plan / UC Course  I have reviewed the triage vital signs and the nursing notes.  Pertinent labs & imaging results that were available during my care of the patient were reviewed by me and considered in my medical decision making (see chart for details).     Angular cheilitis  Bacterial vaginitis   Symptoms and physical exam findings are consistent with angular cheilitis.  This is a condition that can come from history of eczema, nutritional deficits, drooling and several other causes.  This normally will resolve with good moisturizing lip balm's and can also try a low-dose hydrocortisone lotion to the area twice daily.  Make sure to stay up on your multivitamins for pregnancy and eat plenty of protein.  We will send the following prescription into your pharmacy: Hydrocortisone 2.5% twice daily to the affected areas. Make sure to apply a good lip balm for moisturizing such as Carmex, Aquaphor or Vaseline Follow-up as needed  Screening swab done today and results will be available in 24-48 hours. We will contact you if we need to arrange additional treatment based on your testing. Negative results will be on your MyChart account.   Final Clinical Impressions(s) / UC Diagnoses   Final diagnoses:  Angular cheilitis  Bacterial vaginitis     Discharge Instructions      Symptoms and physical exam findings are consistent with angular cheilitis.  This is a condition that can come from  history of eczema, nutritional deficits, drooling and several other causes.  This normally will resolve with good moisturizing lip balm's and can also try a low-dose hydrocortisone lotion to the area twice daily.  Make sure to stay up on your multivitamins for pregnancy and eat plenty of protein.  We will send the following prescription into your pharmacy: Hydrocortisone 2.5% twice daily to the affected areas. Make sure to apply a good lip balm for moisturizing such as Carmex, Aquaphor or Vaseline Follow-up as needed  Screening swab done today and results will be available in 24-48 hours. We will contact you if we need to arrange additional treatment based on your testing. Negative results will be on your MyChart account.     ED Prescriptions     Medication Sig Dispense Auth. Provider   hydrocortisone 2.5 % lotion Apply topically 2 (two) times daily. 59 mL Teresa Almarie LABOR, [place]      PDMP not reviewed this encounter.   Teresa Almarie LABOR, PA-C 12/23/23 1551    Teresa Almarie LABOR, PA-C 12/23/23 1551

## 2023-12-25 ENCOUNTER — Encounter: Payer: Self-pay | Admitting: Advanced Practice Midwife

## 2023-12-27 ENCOUNTER — Encounter: Payer: Self-pay | Admitting: Obstetrics and Gynecology

## 2023-12-27 ENCOUNTER — Ambulatory Visit (INDEPENDENT_AMBULATORY_CARE_PROVIDER_SITE_OTHER): Admitting: Obstetrics and Gynecology

## 2023-12-27 VITALS — BP 137/82 | HR 97 | Wt 169.2 lb

## 2023-12-27 DIAGNOSIS — Z3480 Encounter for supervision of other normal pregnancy, unspecified trimester: Secondary | ICD-10-CM | POA: Diagnosis not present

## 2023-12-27 DIAGNOSIS — Z3A35 35 weeks gestation of pregnancy: Secondary | ICD-10-CM

## 2023-12-27 DIAGNOSIS — R102 Pelvic and perineal pain unspecified side: Secondary | ICD-10-CM | POA: Diagnosis not present

## 2023-12-27 NOTE — Progress Notes (Signed)
 Pressure and vagina pain (scale 7 of 10).   Pt reports awaiting ancillary testing she did at urgent care on 12/23/23. She has completed medication tx.

## 2023-12-27 NOTE — Progress Notes (Signed)
   LOW-RISK PREGNANCY OFFICE VISIT Patient name: Hannah Harrison MRN [id number]  Date of birth: Jul 22, 2005 Chief Complaint:   No chief complaint on file.  History of Present Illness:   Hannah Harrison is a 18 y.o. G73P0000 female at [redacted]w[redacted]d with an Estimated Date of Delivery: 01/29/24 being seen today for ongoing management of a low-risk pregnancy.  Today she reports pelvic and vaginal pressure. Contractions: Irritability. Vag. Bleeding: None.  Movement: Increased. denies leaking of fluid. Review of Systems:   Pertinent items are noted in HPI Denies abnormal vaginal discharge w/ itching/odor/irritation, headaches, visual changes, shortness of breath, chest pain, abdominal pain, severe nausea/vomiting, or problems with urination or bowel movements unless otherwise stated above. Pertinent History Reviewed:  Reviewed past medical,surgical, social, obstetrical and family history.  Reviewed problem list, medications and allergies. Physical Assessment:   Vitals:   12/27/23 1126  BP: 137/82  Pulse: 97  Weight: 169 lb 3.2 oz (76.7 kg)  Body mass index is 30.95 kg/m.        Physical Examination:   General appearance: Well appearing, and in no distress  Mental status: Alert, oriented to person, place, and time  Skin: Warm & dry  Cardiovascular: Normal heart rate noted  Respiratory: Normal respiratory effort, no distress  Abdomen: Soft, gravid, nontender  Pelvic: Cervical exam performed  Dilation: 2 Effacement (%): 70 Station: -1  Extremities: Edema: None  Fetal Status: Fetal Heart Rate (bpm): 148 Fundal Height: 36 cm Movement: Increased Presentation: Vertex  No results found for this or any previous visit (from the past 24 hours).  Assessment & Plan:  1) Low-risk pregnancy G1P0000 at [redacted]w[redacted]d with an Estimated Date of Delivery: 01/29/24   2) Supervision of other normal pregnancy, antepartum (Primary) - Reviewed labor precautions  3) Pelvic pressure in pregnancy, antepartum, third  trimester - Explained pressure is a normal variation of pregnancy  4) Vaginal pain - Explained vaginal pain is a normal variation of pregnancy  5) [redacted] weeks gestation of pregnancy     Meds: No orders of the defined types were placed in this encounter.  Labs/procedures today: cervical exam  Plan:  Continue routine obstetrical care   Reviewed: Preterm labor symptoms and general obstetric precautions including but not limited to vaginal bleeding, contractions, leaking of fluid and fetal movement were reviewed in detail with the patient.  All questions were answered. Has home bp cuff. Check bp weekly, let us  know if >140/90.   Follow-up: Return in about 1 week (around 01/03/2024) for Return OB w/GBS.  No orders of the defined types were placed in this encounter.  Kue Fox MSN, CNM 12/27/2023 12:03 PM

## 2023-12-28 ENCOUNTER — Encounter (HOSPITAL_COMMUNITY): Payer: Self-pay | Admitting: Obstetrics and Gynecology

## 2023-12-28 ENCOUNTER — Inpatient Hospital Stay (HOSPITAL_COMMUNITY)
Admission: AD | Admit: 2023-12-28 | Discharge: 2023-12-28 | Disposition: A | Discharge status: Home / Self Care | Payer: Self-pay | Attending: Obstetrics and Gynecology | Admitting: Obstetrics and Gynecology

## 2023-12-28 ENCOUNTER — Other Ambulatory Visit: Payer: Self-pay

## 2023-12-28 DIAGNOSIS — O99513 Diseases of the respiratory system complicating pregnancy, third trimester: Secondary | ICD-10-CM | POA: Diagnosis not present

## 2023-12-28 DIAGNOSIS — O4703 False labor before 37 completed weeks of gestation, third trimester: Secondary | ICD-10-CM | POA: Diagnosis not present

## 2023-12-28 DIAGNOSIS — Z3685 Encounter for antenatal screening for Streptococcus B: Secondary | ICD-10-CM | POA: Diagnosis present

## 2023-12-28 DIAGNOSIS — Z87891 Personal history of nicotine dependence: Secondary | ICD-10-CM | POA: Diagnosis not present

## 2023-12-28 DIAGNOSIS — Z3A35 35 weeks gestation of pregnancy: Secondary | ICD-10-CM | POA: Diagnosis not present

## 2023-12-28 DIAGNOSIS — O23593 Infection of other part of genital tract in pregnancy, third trimester: Secondary | ICD-10-CM | POA: Insufficient documentation

## 2023-12-28 DIAGNOSIS — B9689 Other specified bacterial agents as the cause of diseases classified elsewhere: Secondary | ICD-10-CM | POA: Diagnosis not present

## 2023-12-28 DIAGNOSIS — Z9151 Personal history of suicidal behavior: Secondary | ICD-10-CM | POA: Insufficient documentation

## 2023-12-28 DIAGNOSIS — O99333 Smoking (tobacco) complicating pregnancy, third trimester: Secondary | ICD-10-CM | POA: Diagnosis present

## 2023-12-28 LAB — URINALYSIS, ROUTINE W REFLEX MICROSCOPIC
Bilirubin Urine: NEGATIVE
Glucose, UA: NEGATIVE mg/dL
Hgb urine dipstick: NEGATIVE
Ketones, ur: NEGATIVE mg/dL
Leukocytes,Ua: NEGATIVE
Nitrite: NEGATIVE
Protein, ur: NEGATIVE mg/dL
Specific Gravity, Urine: 1.004 — ABNORMAL LOW (ref 1.005–1.030)
pH: 7 (ref 5.0–8.0)

## 2023-12-28 LAB — WET PREP, GENITAL
Clue Cells Wet Prep HPF POC: NONE SEEN
Sperm: NONE SEEN
Trich, Wet Prep: NONE SEEN
WBC, Wet Prep HPF POC: <10 (ref <10)
Yeast Wet Prep HPF POC: NONE SEEN

## 2023-12-28 LAB — FETAL FIBRONECTIN: Fetal Fibronectin: NEGATIVE

## 2023-12-28 LAB — GROUP B STREP BY PCR: Group B strep by PCR: NEGATIVE

## 2023-12-28 NOTE — MAU Provider Note (Signed)
 Chief Complaint:  Contractions   HPI   None     Hannah Harrison is a 18 y.o. G1P0000 at [redacted]w[redacted]d who presents to maternity admissions reporting contractions since yesterday. They started again this morning and she also believes she lost her mucus plug. Yesterday, her contractions q82min. Now q79min. Notes that the contraction that she had while provider was in the room was not painful. Denies vaginal bleeding, leakage of fluid, and decreased fetal movement. 10/6 was diagnosed with bacterial vaginosis and she completed her metronidazole . Denies fever, chills, headaches, SOB, chest pain, and abnormal leg swelling. Her cervical exam was 2.5/70/-1 yesterday in the office. Drinking about 2-3 bottles of water daily.   Pregnancy Course: She follows with [facility]. Her pregnancy is uncomplicated.  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   Asthma    Eczema    Intentional drug overdose (HCC) 02/28/2019   Intentional selective serotonin reuptake inhibitor overdose (HCC) 02/28/2019   Primary oligomenorrhea 01/06/2021   Scoliosis    OB History  Gravida Para Term Preterm AB Living  1 0 0 0 0 0  SAB IAB Ectopic Multiple Live Births  0 0 0 0 0    # Outcome Date GA Lbr Len/2nd Weight Sex Type Anes PTL Lv  1 Current            Past Surgical History:  Procedure Laterality Date   NO PAST SURGERIES     Family History  Problem Relation Age of Onset   Hyperlipidemia Maternal Grandmother    Social History   Tobacco Use   Smoking status: Former    Types: Cigars   Smokeless tobacco: Never  Vaping Use   Vaping status: Never Used  Substance Use Topics   Alcohol use: Not Currently    Comment: Occ   Drug use: Not Currently    Types: Marijuana    Comment: Occassionally.   Allergies  Allergen Reactions   Other     Seasonal allergies    Peanut-Containing Drug Products     Other Reaction(s): Unknown   Peanuts [Peanut Oil] Other (See  Comments)    Throat swelling   Shellfish Allergy     Hives and throat swelling  Other Reaction(s): Unknown   No medications prior to admission.    I have reviewed patient's Past Medical Hx, Surgical Hx, Family Hx, Social Hx, medications and allergies.   ROS  Pertinent items noted in HPI and remainder of comprehensive ROS otherwise negative.   PHYSICAL EXAM  Patient Vitals for the past 24 hrs:  BP Temp Temp src Pulse Resp SpO2 Height Weight  12/28/23 2012 119/60 -- -- 99 -- -- -- --  12/28/23 1711 137/84 -- -- (!) 115 -- 99 % -- --  12/28/23 1644 137/83 98.1 F (36.7 C) Oral 88 20 100 % -- --  12/28/23 1635 -- -- -- -- -- -- 5' 2 (1.575 m) 76.5 kg    Constitutional: Well-developed, well-nourished female in no acute distress.  Cardiovascular: Warm and well-perfused Respiratory: normal effort, no problems with respiration noted GI: Gravid MS: Extremities nontender, no edema, normal ROM Neurologic: Alert and oriented x 4.  Pelvic: normal external female genitalia, physiologic discharge, no blood, cervix visibly dilated.   Dilation: 2.5 Effacement (%): 70 Cervical Position: Posterior Station: -1 Presentation: Vertex Exam by:: Dr. Jomarie  Fetal Tracing: Baseline: 130 bpm Variability: moderate Accelerations: present Decelerations: absent Toco: irregular ctx q2-40min   Labs: Results for orders  placed or performed during the hospital encounter of 12/28/23 (from the past 24 hours)  Urinalysis, Routine w reflex microscopic -Urine, Clean Catch     Status: Abnormal   Collection Time: 12/28/23  4:54 PM  Result Value Ref Range   Color, Urine STRAW (A) YELLOW   APPearance HAZY (A) CLEAR   Specific Gravity, Urine 1.004 (L) 1.005 - 1.030   pH 7.0 5.0 - 8.0   Glucose, UA NEGATIVE NEGATIVE mg/dL   Hgb urine dipstick NEGATIVE NEGATIVE   Bilirubin Urine NEGATIVE NEGATIVE   Ketones, ur NEGATIVE NEGATIVE mg/dL   Protein, ur NEGATIVE NEGATIVE mg/dL   Nitrite NEGATIVE NEGATIVE    Leukocytes,Ua NEGATIVE NEGATIVE  Wet prep, genital     Status: None   Collection Time: 12/28/23  6:00 PM   Specimen: Cervix  Result Value Ref Range   Yeast Wet Prep HPF POC NONE SEEN NONE SEEN   Trich, Wet Prep NONE SEEN NONE SEEN   Clue Cells Wet Prep HPF POC NONE SEEN NONE SEEN   WBC, Wet Prep HPF POC <10 <10   Sperm NONE SEEN   Fetal fibronectin     Status: None   Collection Time: 12/28/23  6:00 PM  Result Value Ref Range   Fetal Fibronectin NEGATIVE NEGATIVE  Group B strep by PCR     Status: None   Collection Time: 12/28/23  6:06 PM   Specimen: Cervix; Genital  Result Value Ref Range   Group B strep by PCR PRESUMPTIVE NEGATIVE PRESUMPTIVE NEGATIVE    Imaging:  No results found.  MDM & MAU COURSE  MDM: Moderate  MAU Course: Orders Placed This Encounter  Procedures   Wet prep, genital   Culture, OB Urine   Group B strep by PCR   Urinalysis, Routine w reflex microscopic -Urine, Clean Catch   Fetal fibronectin   Discharge patient   No orders of the defined types were placed in this encounter.  VSS. Patient drinking ample amounts of water, UA showing straw-colored urine otherwise unremarkable. SSE showing dilated cervix, physiologic discharge, wet prep, GC/CT and FFN collected. SVE unchanged from office exam yesterday. Will await lab results and repeat SVE. 8p - Labs negative, SVE unchanged. Continues to not feel contractions. Comfortable with discharge. Return precautions discussed. Next OB appointment 10/23.  ASSESSMENT   1. [redacted] weeks gestation of pregnancy   2. False labor before 37 completed weeks of gestation in third trimester     PLAN  Discharge home in stable condition with return precautions.     Allergies as of 12/28/2023       Reactions   Other    Seasonal allergies   Peanut-containing Drug Products    Other Reaction(s): Unknown   Peanuts [peanut Oil] Other (See Comments)   Throat swelling   Shellfish Allergy    Hives and throat swelling Other  Reaction(s): Unknown        Medication List     STOP taking these medications    fluticasone  50 MCG/ACT nasal spray Commonly known as: FLONASE    metroNIDAZOLE  500 MG tablet Commonly known as: FLAGYL    triamcinolone  acetonide 40 MG/ML injection Commonly known as: KENALOG -40   triamcinolone  cream 0.5 % Commonly known as: KENALOG        TAKE these medications    albuterol  108 (90 Base) MCG/ACT inhaler Commonly known as: VENTOLIN  HFA Inhale 2 puffs into the lungs every 4 (four) hours as needed for wheezing or shortness of breath.   Blood Pressure Kit Devi 1  kit by Does not apply route once a week. Check Blood Pressure regularly and record readings into the [employer] App.  Large Cuff.  DX O90.0 What changed: Another medication with the same name was removed. Continue taking this medication, and follow the directions you see here.   hydrocortisone 2.5 % lotion Apply topically 2 (two) times daily.   Prenatal 28-0.8 MG Tabs Take 1 tablet by mouth daily.        Charlie Courts, MD  Family Medicine - Obstetrics Fellow

## 2023-12-28 NOTE — Discharge Instructions (Signed)
 You came into the Maternity Assessment Unit because you were having contractions last night and earlier today. You have not been feeling the contractions since you've been here in the hospital. Your tests have been negative. We also did 2 cervical exams over 2 hours that were the same as your exam in the office the day before. With all of this information, it is not likely that you are in labor at this time. Please go to your next OB appointment as scheduled. Come back to the MAU if you have painful contractions every 5 minutes or closer, or are having vaginal bleeding, leakage of fluid or decreased fetal movement.

## 2023-12-28 NOTE — MAU Note (Signed)
 Hannah Harrison is a 18 y.o. at [redacted]w[redacted]d here in MAU reporting: she's been having ctxs for the past hour, states ctxs are 7 minutes apart.  Denies VB and LOF, losing mucous plug.  Reports +FM, not as much as usual.   Reports had office visit yesterday, cervical exam 2.5/70/-1 LMP: 03/27/2023 Onset of complaint: today Pain score: 4 Vitals:   12/28/23 1644  BP: 137/83  Pulse: 88  Resp: 20  Temp: 98.1 F (36.7 C)  SpO2: 100%     FHT: 133 bpm  Lab orders placed from triage: UA

## 2023-12-30 LAB — CULTURE, OB URINE
Culture: >=100000 — AB
Special Requests: NORMAL

## 2023-12-31 ENCOUNTER — Ambulatory Visit: Payer: Self-pay

## 2023-12-31 LAB — GC/CHLAMYDIA PROBE AMP (~~LOC~~) NOT AT ARMC
Chlamydia: NEGATIVE
Comment: NEGATIVE
Comment: NORMAL
Neisseria Gonorrhea: NEGATIVE

## 2024-01-03 ENCOUNTER — Encounter: Payer: Self-pay | Admitting: Obstetrics and Gynecology

## 2024-01-03 ENCOUNTER — Ambulatory Visit: Admitting: Obstetrics and Gynecology

## 2024-01-03 VITALS — BP 126/78 | HR 97 | Wt 171.0 lb

## 2024-01-03 DIAGNOSIS — Z3480 Encounter for supervision of other normal pregnancy, unspecified trimester: Secondary | ICD-10-CM | POA: Diagnosis not present

## 2024-01-03 DIAGNOSIS — Z3A36 36 weeks gestation of pregnancy: Secondary | ICD-10-CM

## 2024-01-03 NOTE — Progress Notes (Deleted)
 Pt reported to MAU 12/28/23 for contractions; Group B strep and GC/CC completed at hospital.

## 2024-01-03 NOTE — Progress Notes (Signed)
   PRENATAL VISIT NOTE  Subjective:  Hannah Harrison is a 18 y.o. G1P0000 at [redacted]w[redacted]d being seen today for ongoing prenatal care.  She is currently monitored for the following issues for this low-risk pregnancy and has PCOS (polycystic ovarian syndrome); Supervision of other normal pregnancy, antepartum; History of suicidal ideation (2020); Other forms of scoliosis, thoracic region; Mild intermittent asthma; and Flexural eczema on their problem list.  Patient reports irregular contractions and pelvic pressure.  Contractions: Irregular. Vag. Bleeding: None.  Movement: Present. Denies leaking of fluid.   The following portions of the patient's history were reviewed and updated as appropriate: allergies, current medications, past family history, past medical history, past social history, past surgical history and problem list.   Objective:    Vitals:   01/03/24 1101  BP: 126/78  Pulse: 97  Weight: 171 lb (77.6 kg)    Fetal Status:  Fetal Heart Rate (bpm): 148 Fundal Height: 36 cm Movement: Present Presentation: Vertex  General: Alert, oriented and cooperative. Patient is in no acute distress.  Skin: Skin is warm and dry. No rash noted.   Cardiovascular: Normal heart rate noted  Respiratory: Normal respiratory effort, no problems with respiration noted  Abdomen: Soft, gravid, appropriate for gestational age.  Pain/Pressure: Present     Pelvic: Cervical exam deferred        Extremities: Normal range of motion.  Edema: None  Mental Status: Normal mood and affect. Normal behavior. Normal judgment and thought content.   Assessment and Plan:  Pregnancy: G1P0000 at [redacted]w[redacted]d 1. Supervision of other normal pregnancy, antepartum (Primary) BP and FHR normal Doing well, feeling regular movement  Fh approrpriate  2. [redacted] weeks gestation of pregnancy Swabs completed previously  Labor precautions discussed   Preterm labor symptoms and general obstetric precautions including but not limited to  vaginal bleeding, contractions, leaking of fluid and fetal movement were reviewed in detail with the patient. Please refer to After Visit Summary for other counseling recommendations.   Return in about 1 week (around 01/10/2024) for OB VISIT (MD or APP). Future Appointments  Date Time Provider Department Center  01/09/2024 10:15 AM Delores Nidia CROME, FNP [facility] None    Nidia Delores, FNP

## 2024-01-03 NOTE — Progress Notes (Signed)
 Pt reports headache for two days. She has not taken tylenol , but reports it goes away when she takes a nap. Denies dizziness or vision changes with the headaches.   Went to the MAU 12/28/23 for contractions. Group B strep and GC/CC completed at hospital. Pt reports pain in her right hip and right knee (scale 5 of 10).

## 2024-01-09 ENCOUNTER — Ambulatory Visit (INDEPENDENT_AMBULATORY_CARE_PROVIDER_SITE_OTHER): Admitting: Obstetrics and Gynecology

## 2024-01-09 ENCOUNTER — Encounter: Payer: Self-pay | Admitting: Obstetrics and Gynecology

## 2024-01-09 VITALS — BP 130/78 | HR 104 | Wt 174.0 lb

## 2024-01-09 DIAGNOSIS — Z3480 Encounter for supervision of other normal pregnancy, unspecified trimester: Secondary | ICD-10-CM | POA: Diagnosis not present

## 2024-01-09 DIAGNOSIS — Z3A37 37 weeks gestation of pregnancy: Secondary | ICD-10-CM

## 2024-01-09 NOTE — Progress Notes (Signed)
 Pt presents for ROB visit. C/u pressure. Requesting cervical check.

## 2024-01-09 NOTE — Progress Notes (Signed)
   PRENATAL VISIT NOTE  Subjective:  Hannah Harrison is a 18 y.o. G1P0000 at [redacted]w[redacted]d being seen today for ongoing prenatal care.  She is currently monitored for the following issues for this low-risk pregnancy and has PCOS (polycystic ovarian syndrome); Supervision of other normal pregnancy, antepartum; History of suicidal ideation (2020); Other forms of scoliosis, thoracic region; Mild intermittent asthma; and Flexural eczema on their problem list.  Patient reports vaginal pressure .  Contractions: Irritability. Vag. Bleeding: None.  Movement: Present. Denies leaking of fluid.   The following portions of the patient's history were reviewed and updated as appropriate: allergies, current medications, past family history, past medical history, past social history, past surgical history and problem list.   Objective:    Vitals:   01/09/24 1031 01/09/24 1036  BP: 139/81 130/78  Pulse: 97 (!) 104  Weight: 174 lb (78.9 kg)     Fetal Status:  Fetal Heart Rate (bpm): 138 Fundal Height: 37 cm Movement: Present Presentation: Vertex  General: Alert, oriented and cooperative. Patient is in no acute distress.  Skin: Skin is warm and dry. No rash noted.   Cardiovascular: Normal heart rate noted  Respiratory: Normal respiratory effort, no problems with respiration noted  Abdomen: Soft, gravid, appropriate for gestational age.  Pain/Pressure: Present     Pelvic:   Dilation: 3 Effacement (%): 60 Station: -2  Extremities: Normal range of motion.  Edema: None  Mental Status: Normal mood and affect. Normal behavior. Normal judgment and thought content.   Assessment and Plan:  Pregnancy: G1P0000 at [redacted]w[redacted]d 1. Supervision of other normal pregnancy, antepartum (Primary) BP and FHR normal Feeling vigorous movement  2. [redacted] weeks gestation of pregnancy Labor precautions discussed  Labor readiness discussed Plans on calling [facility]   Term labor symptoms and general obstetric precautions including  but not limited to vaginal bleeding, contractions, leaking of fluid and fetal movement were reviewed in detail with the patient. Please refer to After Visit Summary for other counseling recommendations.   Return in about 1 week (around 01/16/2024) for OB VISIT (MD or APP).    Nidia Daring, FNP

## 2024-01-15 ENCOUNTER — Encounter: Payer: Self-pay | Admitting: Obstetrics and Gynecology

## 2024-01-15 ENCOUNTER — Ambulatory Visit: Admitting: Obstetrics and Gynecology

## 2024-01-15 VITALS — BP 124/79 | HR 90 | Wt 173.0 lb

## 2024-01-15 DIAGNOSIS — Z3480 Encounter for supervision of other normal pregnancy, unspecified trimester: Secondary | ICD-10-CM | POA: Diagnosis not present

## 2024-01-15 DIAGNOSIS — Z3A38 38 weeks gestation of pregnancy: Secondary | ICD-10-CM

## 2024-01-15 DIAGNOSIS — Z1332 Encounter for screening for maternal depression: Secondary | ICD-10-CM | POA: Diagnosis not present

## 2024-01-15 NOTE — Progress Notes (Signed)
   PRENATAL VISIT NOTE  Subjective:  Hannah Harrison is a 18 y.o. G1P0000 at [redacted]w[redacted]d being seen today for ongoing prenatal care.  She is currently monitored for the following issues for this low-risk pregnancy and has PCOS (polycystic ovarian syndrome); Supervision of other normal pregnancy, antepartum; History of suicidal ideation (2020); Other forms of scoliosis, thoracic region; Mild intermittent asthma; and Flexural eczema on their problem list.  Patient reports occasional contractions.  Contractions: Irregular. Vag. Bleeding: None.  Movement: Present. Denies leaking of fluid.   The following portions of the patient's history were reviewed and updated as appropriate: allergies, current medications, past family history, past medical history, past social history, past surgical history and problem list.   Objective:   Vitals:   01/15/24 1007  BP: 124/79  Pulse: 90  Weight: 173 lb (78.5 kg)    Fetal Status:  Fetal Heart Rate (bpm): 142   Movement: Present    General: Alert, oriented and cooperative. Patient is in no acute distress.  Skin: Skin is warm and dry. No rash noted.   Cardiovascular: Normal heart rate noted  Respiratory: Normal respiratory effort, no problems with respiration noted  Abdomen: Soft, gravid, appropriate for gestational age.  Pain/Pressure: Present     Pelvic: Cervical exam performed in the presence of a chaperone        Extremities: Normal range of motion.  Edema: None  Mental Status: Normal mood and affect. Normal behavior. Normal judgment and thought content.      01/15/2024   10:05 AM 06/14/2023   11:17 AM 01/02/2022    2:26 PM  Depression screen PHQ 2/9  Decreased Interest 0 0 0  Down, Depressed, Hopeless 0 0 0  PHQ - 2 Score 0 0 0  Altered sleeping 0 0 0  Tired, decreased energy 0 0 0  Change in appetite 0 0 0  Feeling bad or failure about yourself  0 0 0  Trouble concentrating 0 0 0  Moving slowly or fidgety/restless 0 0 0  Suicidal thoughts 0  0 0  PHQ-9 Score 0 0 0  Difficult doing work/chores Not difficult at all          01/15/2024   10:06 AM 06/14/2023   11:17 AM 01/02/2022    2:28 PM  GAD 7 : Generalized Anxiety Score  Nervous, Anxious, on Edge 0 0 0  Control/stop worrying 0 0 0  Worry too much - different things 0 0 0  Trouble relaxing 0 0 0  Restless 0 0 0  Easily annoyed or irritable 0 0 0  Afraid - awful might happen 0 0 0  Total GAD 7 Score 0 0 0  Anxiety Difficulty Not difficult at all      Assessment and Plan:  Pregnancy: G1P0000 at [redacted]w[redacted]d 1. Supervision of other normal pregnancy, antepartum (Primary) Reviewed sx of labor.  She may want membrane sweep next visit.  Discussed IOL at 38 if she makes it that far.   2. Pregnancy with 38 completed weeks gestation   Term labor symptoms and general obstetric precautions including but not limited to vaginal bleeding, contractions, leaking of fluid and fetal movement were reviewed in detail with the patient. Please refer to After Visit Summary for other counseling recommendations.   No follow-ups on file.  No future appointments.  Vina Solian, MD

## 2024-01-15 NOTE — Progress Notes (Signed)
ROB, c/o contractions

## 2024-01-16 ENCOUNTER — Encounter: Admitting: Obstetrics

## 2024-01-23 ENCOUNTER — Telehealth (HOSPITAL_COMMUNITY): Payer: Self-pay | Admitting: *Deleted

## 2024-01-23 ENCOUNTER — Encounter: Payer: Self-pay | Admitting: Obstetrics and Gynecology

## 2024-01-23 ENCOUNTER — Ambulatory Visit: Admitting: Obstetrics and Gynecology

## 2024-01-23 ENCOUNTER — Encounter (HOSPITAL_COMMUNITY): Payer: Self-pay | Admitting: *Deleted

## 2024-01-23 VITALS — BP 133/79 | HR 96 | Wt 179.6 lb

## 2024-01-23 DIAGNOSIS — Z3480 Encounter for supervision of other normal pregnancy, unspecified trimester: Secondary | ICD-10-CM

## 2024-01-23 DIAGNOSIS — Z3A39 39 weeks gestation of pregnancy: Secondary | ICD-10-CM

## 2024-01-23 DIAGNOSIS — Z3483 Encounter for supervision of other normal pregnancy, third trimester: Secondary | ICD-10-CM | POA: Diagnosis not present

## 2024-01-23 NOTE — Addendum Note (Signed)
 Addended by: DELORES NIDIA CROME on: 01/23/2024 01:49 PM   Modules accepted: Orders

## 2024-01-23 NOTE — Patient Instructions (Signed)
 Things to Try After 37 weeks to Encourage Labor/Get Ready for Labor:    Try the [employer] at [email or web address].[email or web address] daily to improve baby's position and

## 2024-01-23 NOTE — Progress Notes (Signed)
 Pt presents for ROB visit. Requesting cervical check and membrane sweep.

## 2024-01-23 NOTE — Progress Notes (Signed)
 PRENATAL VISIT NOTE  Subjective:  Hannah Harrison is a 18 y.o. G1P0000 at [redacted]w[redacted]d being seen today for ongoing prenatal care.  She is currently monitored for the following issues for this low-risk pregnancy and has PCOS (polycystic ovarian syndrome); Supervision of other normal pregnancy, antepartum; History of suicidal ideation (2020); Other forms of scoliosis, thoracic region; Mild intermittent asthma; and Flexural eczema on their problem list.  Patient reports desires membrane sweep.  Contractions: Not present. Vag. Bleeding: None.  Movement: Present. Denies leaking of fluid.   The following portions of the patient's history were reviewed and updated as appropriate: allergies, current medications, past family history, past medical history, past social history, past surgical history and problem list.   Objective:   Vitals:   01/23/24 1033 01/23/24 1039  BP: (!) 142/80 133/79  Pulse: 94 96  Weight: 179 lb 9.6 oz (81.5 kg)     Fetal Status:  Fetal Heart Rate (bpm): 138   Movement: Present Presentation: Vertex  General: Alert, oriented and cooperative. Patient is in no acute distress.  Skin: Skin is warm and dry. No rash noted.   Cardiovascular: Normal heart rate noted  Respiratory: Normal respiratory effort, no problems with respiration noted  Abdomen: Soft, gravid, appropriate for gestational age.  Pain/Pressure: Present     Pelvic: Cervical exam performed in the presence of a chaperone Dilation: 4 Effacement (%): 70 Station: -1  Extremities: Normal range of motion.  Edema: None  Mental Status: Normal mood and affect. Normal behavior. Normal judgment and thought content.      01/15/2024   10:05 AM 06/14/2023   11:17 AM 01/02/2022    2:26 PM  Depression screen PHQ 2/9  Decreased Interest 0 0 0  Down, Depressed, Hopeless 0 0 0  PHQ - 2 Score 0 0 0  Altered sleeping 0 0 0  Tired, decreased energy 0 0 0  Change in appetite 0 0 0  Feeling bad or failure about yourself  0 0 0   Trouble concentrating 0 0 0  Moving slowly or fidgety/restless 0 0 0  Suicidal thoughts 0 0 0  PHQ-9 Score 0  0  0   Difficult doing work/chores Not difficult at all       Data saved with a previous flowsheet row definition        01/15/2024   10:06 AM 06/14/2023   11:17 AM 01/02/2022    2:28 PM  GAD 7 : Generalized Anxiety Score  Nervous, Anxious, on Edge 0 0 0  Control/stop worrying 0 0 0  Worry too much - different things 0 0 0  Trouble relaxing 0 0 0  Restless 0 0 0  Easily annoyed or irritable 0 0 0  Afraid - awful might happen 0 0 0  Total GAD 7 Score 0 0 0  Anxiety Difficulty Not difficult at all      Assessment and Plan:  Pregnancy: G1P0000 at [redacted]w[redacted]d 1. Supervision of other normal pregnancy, antepartum (Primary) BP and FHR normal Doing well, feeling regular movement    2. [redacted] weeks gestation of pregnancy Discussed membrane sweeping today. Reviewed data on membrane sweeping  Reviewed risk of cramping, contractions, bleeding and ROM. Answered patient questions and she agreed to proceed with procedure.   Encouraged miles Proofreader precautions discussed  IOL at 41 weeks if not delivered prior   Term labor symptoms and general obstetric precautions including but not limited to vaginal bleeding, contractions, leaking of fluid and fetal movement were reviewed  in detail with the patient. Please refer to After Visit Summary for other counseling recommendations.   Return in about 1 week (around 01/30/2024) for OB VISIT (MD or APP).    Nidia Daring, FNP

## 2024-01-23 NOTE — Telephone Encounter (Signed)
 Preadmission screen

## 2024-01-25 ENCOUNTER — Other Ambulatory Visit: Payer: Self-pay

## 2024-01-25 ENCOUNTER — Encounter (HOSPITAL_COMMUNITY): Payer: Self-pay | Admitting: Obstetrics & Gynecology

## 2024-01-25 ENCOUNTER — Inpatient Hospital Stay (HOSPITAL_COMMUNITY)
Admission: AD | Admit: 2024-01-25 | Discharge: 2024-01-27 | DRG: 807 | Disposition: A | Discharge status: Home / Self Care | Attending: Obstetrics and Gynecology | Admitting: Obstetrics and Gynecology

## 2024-01-25 DIAGNOSIS — Z8759 Personal history of other complications of pregnancy, childbirth and the puerperium: Secondary | ICD-10-CM

## 2024-01-25 DIAGNOSIS — Z3A39 39 weeks gestation of pregnancy: Secondary | ICD-10-CM

## 2024-01-25 DIAGNOSIS — Z3493 Encounter for supervision of normal pregnancy, unspecified, third trimester: Secondary | ICD-10-CM | POA: Insufficient documentation

## 2024-01-25 DIAGNOSIS — Z3480 Encounter for supervision of other normal pregnancy, unspecified trimester: Secondary | ICD-10-CM

## 2024-01-25 DIAGNOSIS — O9952 Diseases of the respiratory system complicating childbirth: Secondary | ICD-10-CM | POA: Diagnosis present

## 2024-01-25 DIAGNOSIS — O1404 Mild to moderate pre-eclampsia, complicating childbirth: Principal | ICD-10-CM | POA: Diagnosis present

## 2024-01-25 DIAGNOSIS — M4184 Other forms of scoliosis, thoracic region: Secondary | ICD-10-CM | POA: Diagnosis present

## 2024-01-25 DIAGNOSIS — O99892 Other specified diseases and conditions complicating childbirth: Secondary | ICD-10-CM | POA: Diagnosis present

## 2024-01-25 DIAGNOSIS — Z87891 Personal history of nicotine dependence: Secondary | ICD-10-CM

## 2024-01-25 DIAGNOSIS — O133 Gestational [pregnancy-induced] hypertension without significant proteinuria, third trimester: Principal | ICD-10-CM

## 2024-01-25 DIAGNOSIS — J4520 Mild intermittent asthma, uncomplicated: Secondary | ICD-10-CM | POA: Diagnosis present

## 2024-01-25 DIAGNOSIS — O1490 Unspecified pre-eclampsia, unspecified trimester: Secondary | ICD-10-CM | POA: Diagnosis present

## 2024-01-25 DIAGNOSIS — Z3490 Encounter for supervision of normal pregnancy, unspecified, unspecified trimester: Secondary | ICD-10-CM | POA: Diagnosis present

## 2024-01-25 LAB — CBC
HCT: 39.1 % (ref 36.0–46.0)
HCT: 37 % (ref 36.0–46.0)
Hemoglobin: 12.9 g/dL (ref 12.0–15.0)
Hemoglobin: 12 g/dL (ref 12.0–15.0)
MCH: 29.3 pg (ref 26.0–34.0)
MCH: 29 pg (ref 26.0–34.0)
MCHC: 33 g/dL (ref 30.0–36.0)
MCHC: 32.4 g/dL (ref 30.0–36.0)
MCV: 88.7 fL (ref 80.0–100.0)
MCV: 89.4 fL (ref 80.0–100.0)
Platelets: 231 K/uL (ref 150–400)
Platelets: 225 K/uL (ref 150–400)
RBC: 4.41 MIL/uL (ref 3.87–5.11)
RBC: 4.14 MIL/uL (ref 3.87–5.11)
RDW: 15 % (ref 11.5–15.5)
RDW: 15.1 % (ref 11.5–15.5)
WBC: 10.3 K/uL (ref 4.0–10.5)
WBC: 11.8 K/uL — ABNORMAL HIGH (ref 4.0–10.5)
nRBC: 0 % (ref 0.0–0.2)
nRBC: 0 % (ref 0.0–0.2)

## 2024-01-25 LAB — COMPREHENSIVE METABOLIC PANEL WITH GFR
ALT: 15 U/L (ref 0–44)
AST: 26 U/L (ref 15–41)
Albumin: 2.9 g/dL — ABNORMAL LOW (ref 3.5–5.0)
Alkaline Phosphatase: 267 U/L — ABNORMAL HIGH (ref 38–126)
Anion gap: 10 (ref 5–15)
BUN: 6 mg/dL (ref 6–20)
CO2: 22 mmol/L (ref 22–32)
Calcium: 9.3 mg/dL (ref 8.9–10.3)
Chloride: 103 mmol/L (ref 98–111)
Creatinine, Ser: 0.6 mg/dL (ref 0.44–1.00)
GFR, Estimated: >60 mL/min (ref >60)
Glucose, Bld: 108 mg/dL — ABNORMAL HIGH (ref 70–99)
Potassium: 3.7 mmol/L (ref 3.5–5.1)
Sodium: 135 mmol/L (ref 135–145)
Total Bilirubin: 0.3 mg/dL (ref 0.0–1.2)
Total Protein: 7 g/dL (ref 6.5–8.1)

## 2024-01-25 LAB — PROTEIN / CREATININE RATIO, URINE
Creatinine, Urine: 48 mg/dL
Protein Creatinine Ratio: 0.31 mg/mg{creat} — ABNORMAL HIGH (ref 0.00–0.15)
Total Protein, Urine: 15 mg/dL

## 2024-01-25 LAB — TYPE AND SCREEN
ABO/RH(D): AB POS
Antibody Screen: NEGATIVE

## 2024-01-25 MED ORDER — DIPHENHYDRAMINE HCL 50 MG/ML IJ SOLN: 12.5000 mg | INTRAMUSCULAR | Status: DC | PRN

## 2024-01-25 MED ORDER — ACETAMINOPHEN 325 MG PO TABS: 650.0000 mg | ORAL_TABLET | ORAL | Status: DC | PRN

## 2024-01-25 MED ORDER — PHENYLEPHRINE 80 MCG/ML (10ML) SYRINGE FOR IV PUSH (FOR BLOOD PRESSURE SUPPORT)
80.0000 ug | PREFILLED_SYRINGE | INTRAVENOUS | Status: DC | PRN
  Filled 2024-01-25: qty 10

## 2024-01-25 MED ORDER — TERBUTALINE SULFATE 1 MG/ML IJ SOLN: 0.2500 mg | Freq: Once | INTRAMUSCULAR | Status: DC | PRN

## 2024-01-25 MED ORDER — FENTANYL CITRATE (PF) 100 MCG/2ML IJ SOLN: 100.0000 ug | INTRAMUSCULAR | Status: DC | PRN

## 2024-01-25 MED ORDER — EPHEDRINE 5 MG/ML INJ: 10.0000 mg | INTRAVENOUS | Status: DC | PRN

## 2024-01-25 MED ORDER — LACTATED RINGERS IV SOLN: 500.0000 mL | INTRAVENOUS | Status: DC | PRN

## 2024-01-25 MED ORDER — LIDOCAINE HCL (PF) 1 % IJ SOLN: 30.0000 mL | INTRAMUSCULAR | Status: DC | PRN

## 2024-01-25 MED ORDER — FENTANYL-BUPIVACAINE-NACL 0.5-0.125-0.9 MG/250ML-% EP SOLN
12.0000 mL/h | EPIDURAL | Status: DC | PRN
  Administered 2024-01-26: 12 mL/h via EPIDURAL
  Filled 2024-01-25: qty 250

## 2024-01-25 MED ORDER — OXYTOCIN BOLUS FROM INFUSION
333.0000 mL | Freq: Once | INTRAVENOUS | Status: AC
  Administered 2024-01-26: 333 mL via INTRAVENOUS

## 2024-01-25 MED ORDER — ONDANSETRON HCL 4 MG/2ML IJ SOLN
4.0000 mg | Freq: Four times a day (QID) | INTRAMUSCULAR | Status: DC | PRN
  Administered 2024-01-26: 4 mg via INTRAVENOUS
  Filled 2024-01-25: qty 2

## 2024-01-25 MED ORDER — OXYTOCIN-SODIUM CHLORIDE 30-0.9 UT/500ML-% IV SOLN
2.5000 [IU]/h | INTRAVENOUS | Status: DC
  Administered 2024-01-26: 2.5 [IU]/h via INTRAVENOUS
  Filled 2024-01-25 (×2): qty 500

## 2024-01-25 MED ORDER — SOD CITRATE-CITRIC ACID 500-334 MG/5ML PO SOLN: 30.0000 mL | ORAL | Status: DC | PRN

## 2024-01-25 MED ORDER — LACTATED RINGERS IV SOLN: INTRAVENOUS | Status: DC

## 2024-01-25 MED ORDER — OXYTOCIN-SODIUM CHLORIDE 30-0.9 UT/500ML-% IV SOLN
1.0000 m[IU]/min | INTRAVENOUS | Status: DC
  Administered 2024-01-25: 2 m[IU]/min via INTRAVENOUS

## 2024-01-25 MED ORDER — LACTATED RINGERS IV SOLN: 500.0000 mL | Freq: Once | INTRAVENOUS | Status: DC

## 2024-01-25 MED ORDER — PHENYLEPHRINE 80 MCG/ML (10ML) SYRINGE FOR IV PUSH (FOR BLOOD PRESSURE SUPPORT): 80.0000 ug | PREFILLED_SYRINGE | INTRAVENOUS | Status: DC | PRN

## 2024-01-25 NOTE — H&P (Signed)
 OBSTETRIC ADMISSION HISTORY AND PHYSICAL  Hannah Harrison is a 18 y.o. female G1P0000 with IUP at [redacted]w[redacted]d by 7w U/S presenting for IOL for mild PEC. She reports +FMs, No LOF, no VB, no blurry vision, headaches or peripheral edema, and RUQ pain.  She plans on breast and formula feeding. She requests POPs for birth control. She received her prenatal care at [facility]  Dating: By [redacted]w[redacted]d U/S --->  Estimated Date of Delivery: 01/29/24  Sono: @[redacted]w[redacted]d , CWD, normal anatomy, transverse presentation, anterior placenta, 302g, 80% EFW  Prenatal History/Complications: gHTN, mild PEC (both diagnosed today)  NURSING  PROVIDER  Office Location [facility] Dating by US  at [redacted]w[redacted]d  [facility] Traditional Anatomy U/S Normal, anterior placenta  Initiated care at  11wks                Language  English              LAB RESULTS   Support Person Mom and FOB Genetics NIPS: LR female AFP: neg    NT/IT (FT only)     Carrier Screen Horizon: neg  Rhogam  AB/Positive/-- (04/22 1411) A1C/GTT Early HgbA1C:  Third trimester 2 hr GTT: nml  Flu Vaccine  No    TDaP Vaccine  11/01/23 Blood Type AB/Positive/-- (04/22 1411)  RSV Vaccine  Antibody Negative (04/22 1411)  COVID Vaccine  No Rubella 2.81 (04/22 1411)  Feeding Plan BREAST RPR Non Reactive (08/21 0830)  Contraception Nexplanon -> changed to POPs HBsAg Negative (04/22 1411)  Circumcision Yes if female HIV Non Reactive (08/21 0830)  Pediatrician  Planning RICE center HCVAb Non Reactive (04/22 1411)  Prenatal Classes     BTL Consent  Pap None (age)  BTL Pre-payment  GC/CT Initial: Negative 36wks:  Negative  VBAC Consent  GBS PRESUMPTIVE NEGATIVE/-- (10/17 1806) For PCN allergy, check sensitivities   BRx Optimized? 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   Scoliosis    Past Surgical History: Past Surgical History:  Procedure Laterality Date   NO PAST SURGERIES     Obstetrical History: OB History     Gravida  1   Para  0   Term  0   Preterm  0   AB  0   Living  0      SAB  0   IAB  0   Ectopic  0   Multiple  0   Live Births  0          Social History Social History   Socioeconomic History   Marital status: Single    Spouse name: Not on file   Number of children: Not on file   Years of education: Not on file   Highest education level: Not on file  Occupational History   Not on file  Tobacco Use   Smoking status: Former    Types: Cigars   Smokeless tobacco: Never  Vaping Use   Vaping status: Never Used  Substance and Sexual Activity   Alcohol use: Not Currently    Comment: Occ   Drug use: Not Currently    Types: Marijuana    Comment: Stopped in September 2025   Sexual activity: Yes    Partners: Male    Birth control/protection: None  Other Topics Concern   Not on file  Social History Narrative   ** Merged History Encounter **       ** Merged History Encounter **       Social Drivers of Corporate Investment Banker Strain: Not on file  Food Insecurity: No Food Insecurity (12/28/2023)   Hunger Vital Sign    Worried About Running Out of Food in the Last Year: Never true    Ran Out of Food in the Last Year: Never true  Transportation Needs: No Transportation Needs (12/28/2023)   PRAPARE - Administrator, Civil Service (Medical): No    Lack of Transportation (Non-Medical): No  Physical Activity: Not on file  Stress: Not on file  Social Connections: Not on file   Family History: Family History  Problem Relation Age of  Onset   Healthy Mother    Healthy Father    Hyperlipidemia Maternal Grandmother    Allergies: Allergies  Allergen Reactions   Other     Seasonal allergies    Peanut-Containing Drug Products     Other Reaction(s): Unknown   Peanuts [Peanut Oil] Other (See Comments)    Throat swelling   Shellfish Allergy     Hives and throat swelling  Other Reaction(s): Unknown   Medications Prior to Admission  Medication Sig Dispense Refill Last Dose/Taking   Prenatal 28-0.8 MG TABS Take 1 tablet by mouth daily. 30 tablet 12 01/24/2024   albuterol  (VENTOLIN  HFA) 108 (90 Base) MCG/ACT inhaler Inhale 2 puffs into the lungs every 4 (four) hours as needed for wheezing or shortness of breath.   More than a month   Blood Pressure Monitoring (BLOOD PRESSURE KIT) DEVI 1 kit by Does not apply route once a week. Check Blood Pressure regularly and record readings into the Babyscripts App.  Large Cuff.  DX O90.0 (Patient not taking: Reported on 01/23/2024) 1 each 0    hydrocortisone 2.5 % lotion Apply topically 2 (two) times daily. 59 mL 0    Review of Systems  All systems reviewed and negative except as stated in HPI  Blood pressure (!) 140/75, pulse (!) 110, temperature 97.9 F (36.6 C), temperature source Oral, resp. rate 20, last menstrual period 03/27/2023, SpO2 99%. General appearance: alert, cooperative, appears stated age, and no distress Lungs: clear to auscultation bilaterally Heart: regular rate and rhythm Abdomen: soft, non-tender; bowel sounds normal Pelvic: normal external female genitalia, no blood Extremities: Homans sign is negative, no sign of DVT DTR's normal Presentation: cephalic Fetal monitoring - Baseline: 135 bpm, Variability: Good {> 6 bpm), Accelerations: Reactive, and Decelerations: Absent Uterine activity - occasional, q38min Dilation: 4 Effacement (%): 70 Station: -1 Exam by:: PHEBE Blumenthal, RNC  Prenatal labs: ABO, Rh: --/--/AB POS (11/14 1408) Antibody: NEG (11/14  1408) Rubella: 2.81 (04/22 1411) RPR: Non Reactive (08/21 0830)  HBsAg: Negative (04/22 1411)  HIV: Non Reactive (08/21 0830)  GBS: PRESUMPTIVE NEGATIVE/-- (10/17 1806)    Lab Results  Component Value Date   GBS PRESUMPTIVE NEGATIVE 12/28/2023   GTT normal Genetic screening normal Anatomy US  normal  Immunization History  Administered Date(s) Administered   Tdap  11/01/2023   Prenatal Transfer Tool  Maternal Diabetes: No Genetic Screening: Normal Maternal Ultrasounds/Referrals: Normal Fetal Ultrasounds or other Referrals:  None Maternal Substance Abuse:  No Significant Maternal Medications:  None Significant Maternal Lab Results: Group B Strep negative Number of Prenatal Visits:greater than 3 verified prenatal visits Maternal Vaccinations: None Other Comments:  None   Results for orders placed or performed during the hospital encounter of 01/25/24 (from the past 24 hours)  Type and screen   Collection Time: 01/25/24  2:08 PM  Result Value Ref Range   ABO/RH(D) AB POS    Antibody Screen NEG    Sample Expiration      01/28/2024,2359 Performed at [facility], [address], [place], [place] [address]   CBC   Collection Time: 01/25/24  2:15 PM  Result Value Ref Range   WBC 10.3 4.0 - 10.5 K/uL   RBC 4.41 3.87 - 5.11 MIL/uL   Hemoglobin 12.9 12.0 - 15.0 g/dL   HCT 60.8 63.9 - 53.9 %   MCV 88.7 80.0 - 100.0 fL   MCH 29.3 26.0 - 34.0 pg   MCHC 33.0 30.0 - 36.0 g/dL   RDW 84.9 88.4 - 84.4 %   Platelets 231 150 - 400 K/uL   nRBC 0.0 0.0 - 0.2 %  Protein / creatinine ratio, urine   Collection Time: 01/25/24  2:44 PM  Result Value Ref Range   Creatinine, Urine 48 mg/dL   Total Protein, Urine 15 mg/dL   Protein Creatinine Ratio 0.31 (H) 0.00 - 0.15 mg/mg[Cre]   Patient Active Problem List   Diagnosis Date Noted   Other forms of scoliosis, thoracic region 12/23/2023   Mild intermittent asthma 12/23/2023   Flexural eczema 12/23/2023   History of suicidal  ideation (2020) 09/04/2023   Supervision of other normal pregnancy, antepartum 06/14/2023   PCOS (polycystic ovarian syndrome) 01/06/2021   Assessment/Plan:  Coretta Pica Kimberlin is a 18 y.o. G1P0000 at [redacted]w[redacted]d here for IOL for mild preeclampsia (Pcr: 0.31)  #Labor: Will start with pitocin titration and ambulation after she eats. #Pain: Planning epidural #PEC: no s/sx severe PEC #FWB: Cat 1 #GBS status:  negative #Feeding: Breastmilk  and Formula #Reproductive Life planning: Progesterone  only pills #Circ:  not applicable  Cornell JONELLE Finder, CNM  01/25/2024, 5:25 PM

## 2024-01-25 NOTE — MAU Note (Signed)
 Hannah Harrison Dement is a 18 y.o. at [redacted]w[redacted]d here in MAU reporting: she's having ctxs that are 6-7 minutes apart since 1230 this afternoon.  Denies VB and LOF, but lost mucous plug.  Endorses +FM.  LMP: 03/27/2023 Onset of complaint: today Pain score: 7 Vitals:   01/25/24 1345  BP: (!) 142/82  Pulse: (!) 106  Resp: 20  Temp: 97.9 F (36.6 C)  SpO2: 100%     FHT: 135 bpm  Lab orders placed from triage: None

## 2024-01-25 NOTE — Progress Notes (Signed)
 Patient ID: Hannah Harrison, female   DOB: 12/08/2005, 18 y.o.   MRN: [id number]  Subjective: -Care assumed of 18 y.o. G1P0 at [redacted]w[redacted]d who presents for IOL s/t gHTN with Mild PreE dx after evaluation. In room to meet acquaintance of patient and family (FOB, Mother, and MIL).  Patient denies regular contractions. No HA, visual disturbances, edema, or RUQ pain. Good fetal movement appreciated.   Objective: BP 137/77   Pulse 88   Temp 97.9 F (36.6 C) (Oral)   Resp 20   LMP 03/27/2023 (Exact Date)   SpO2 99%  No intake/output data recorded. No intake/output data recorded.  Fetal Monitoring: FHT: 135 bpm, Mod Var, -Decels, +Accels UC: Palpates mild to moderate    Physical Exam: General appearance: alert, well appearing, and in no distress. Chest: normal rate and regular rhythm.  clear to auscultation, no wheezes, rales or rhonchi, symmetric air entry. Abdominal exam: Gravid, Appears AGA. Extremities: No edema  Skin exam: Warm Dry  Vaginal Exam: SVE:   Dilation: 4 Effacement (%): 70 Station: -1 Exam by:: Hannah Harrison, RNC Membranes:Intact Internal Monitors: None  Augmentation/Induction: Pitocin:2mUn/min Cytotec: None  Assessment:  IUP at 39.3 weeks Cat I FT  IOL Mild PreE  Plan: -Pitocin started and will be titrated accordingly.  -Consider AROM at later time.  -Continue other mgmt as ordered.   Hannah CROME Yaneisy Wenz,MSN, CNM 01/25/2024, 9:22 PM

## 2024-01-26 ENCOUNTER — Inpatient Hospital Stay (HOSPITAL_COMMUNITY): Admitting: Anesthesiology

## 2024-01-26 ENCOUNTER — Encounter (HOSPITAL_COMMUNITY): Payer: Self-pay | Admitting: Obstetrics and Gynecology

## 2024-01-26 DIAGNOSIS — Z3490 Encounter for supervision of normal pregnancy, unspecified, unspecified trimester: Secondary | ICD-10-CM | POA: Diagnosis present

## 2024-01-26 DIAGNOSIS — O1490 Unspecified pre-eclampsia, unspecified trimester: Secondary | ICD-10-CM | POA: Diagnosis present

## 2024-01-26 LAB — CBC
HCT: 35.9 % — ABNORMAL LOW (ref 36.0–46.0)
Hemoglobin: 11.7 g/dL — ABNORMAL LOW (ref 12.0–15.0)
MCH: 28.9 pg (ref 26.0–34.0)
MCHC: 32.6 g/dL (ref 30.0–36.0)
MCV: 88.6 fL (ref 80.0–100.0)
Platelets: 204 K/uL (ref 150–400)
RBC: 4.05 MIL/uL (ref 3.87–5.11)
RDW: 15 % (ref 11.5–15.5)
WBC: 13.2 K/uL — ABNORMAL HIGH (ref 4.0–10.5)
nRBC: 0 % (ref 0.0–0.2)

## 2024-01-26 LAB — RPR: RPR Ser Ql: NONREACTIVE

## 2024-01-26 MED ORDER — INFLUENZA VIRUS VACC SPLIT PF (FLUZONE) 0.5 ML IM SUSY: 0.5000 mL | PREFILLED_SYRINGE | INTRAMUSCULAR | Status: DC | PRN

## 2024-01-26 MED ORDER — MAGNESIUM HYDROXIDE 400 MG/5ML PO SUSP: 30.0000 mL | Freq: Every day | ORAL | Status: DC | PRN

## 2024-01-26 MED ORDER — COCONUT OIL OIL
1.0000 | TOPICAL_OIL | Status: DC | PRN
Start: 2024-01-26 — End: 2024-01-27
  Administered 2024-01-27: 1 via TOPICAL

## 2024-01-26 MED ORDER — MEASLES, MUMPS & RUBELLA VAC ~~LOC~~ SUSR: 0.5000 mL | Freq: Once | SUBCUTANEOUS | Status: DC

## 2024-01-26 MED ORDER — LIDOCAINE-EPINEPHRINE (PF) 2 %-1:200000 IJ SOLN
INTRAMUSCULAR | Status: DC | PRN
  Administered 2024-01-26: 7 mL via EPIDURAL

## 2024-01-26 MED ORDER — SENNOSIDES-DOCUSATE SODIUM 8.6-50 MG PO TABS: 2.0000 | ORAL_TABLET | Freq: Every evening | ORAL | Status: DC | PRN

## 2024-01-26 MED ORDER — WITCH HAZEL-GLYCERIN EX PADS
1.0000 | MEDICATED_PAD | CUTANEOUS | Status: DC | PRN
Start: 2024-01-26 — End: 2024-01-27
  Administered 2024-01-26: 1 via TOPICAL

## 2024-01-26 MED ORDER — ACETAMINOPHEN 500 MG PO TABS
1000.0000 mg | ORAL_TABLET | Freq: Four times a day (QID) | ORAL | Status: DC
  Administered 2024-01-26 – 2024-01-27 (×5): 1000 mg via ORAL
  Filled 2024-01-26 (×5): qty 2

## 2024-01-26 MED ORDER — POTASSIUM CHLORIDE CRYS ER 20 MEQ PO TBCR
20.0000 meq | EXTENDED_RELEASE_TABLET | Freq: Every day | ORAL | Status: DC
Start: 2024-01-26 — End: 2024-01-31
  Administered 2024-01-26 – 2024-01-27 (×2): 20 meq via ORAL
  Filled 2024-01-26 (×2): qty 1

## 2024-01-26 MED ORDER — BENZOCAINE-MENTHOL 20-0.5 % EX AERO
1.0000 | INHALATION_SPRAY | CUTANEOUS | Status: DC | PRN
  Administered 2024-01-26: 1 via TOPICAL
  Filled 2024-01-26: qty 56

## 2024-01-26 MED ORDER — TETANUS-DIPHTH-ACELL PERTUSSIS 5-2-15.5 LF-MCG/0.5 IM SUSP: 0.5000 mL | Freq: Once | INTRAMUSCULAR | Status: DC

## 2024-01-26 MED ORDER — ONDANSETRON HCL 4 MG/2ML IJ SOLN: 4.0000 mg | INTRAMUSCULAR | Status: DC | PRN

## 2024-01-26 MED ORDER — SIMETHICONE 80 MG PO CHEW: 80.0000 mg | CHEWABLE_TABLET | ORAL | Status: DC | PRN

## 2024-01-26 MED ORDER — DIPHENHYDRAMINE HCL 25 MG PO CAPS: 25.0000 mg | ORAL_CAPSULE | Freq: Four times a day (QID) | ORAL | Status: DC | PRN

## 2024-01-26 MED ORDER — ONDANSETRON HCL 4 MG PO TABS: 4.0000 mg | ORAL_TABLET | ORAL | Status: DC | PRN

## 2024-01-26 MED ORDER — FUROSEMIDE 20 MG PO TABS
20.0000 mg | ORAL_TABLET | Freq: Every day | ORAL | Status: DC
  Administered 2024-01-26 – 2024-01-27 (×2): 20 mg via ORAL
  Filled 2024-01-26 (×2): qty 1

## 2024-01-26 MED ORDER — IBUPROFEN 600 MG PO TABS
600.0000 mg | ORAL_TABLET | Freq: Four times a day (QID) | ORAL | Status: DC
  Administered 2024-01-26 – 2024-01-27 (×6): 600 mg via ORAL
  Filled 2024-01-26 (×6): qty 1

## 2024-01-26 MED ORDER — LIDOCAINE HCL (PF) 1 % IJ SOLN
INTRAMUSCULAR | Status: DC | PRN
  Administered 2024-01-26 (×2): 4 mL via EPIDURAL

## 2024-01-26 MED ORDER — DIBUCAINE (PERIANAL) 1 % EX OINT: 1.0000 | TOPICAL_OINTMENT | CUTANEOUS | Status: DC | PRN

## 2024-01-26 MED ORDER — CEFAZOLIN SODIUM-DEXTROSE 2-4 GM/100ML-% IV SOLN: 2.0000 g | Freq: Once | INTRAVENOUS | Status: DC

## 2024-01-26 MED ORDER — PRENATAL MULTIVITAMIN CH
1.0000 | ORAL_TABLET | Freq: Every day | ORAL | Status: DC
  Administered 2024-01-26 – 2024-01-27 (×2): 1 via ORAL
  Filled 2024-01-26 (×2): qty 1

## 2024-01-26 NOTE — Anesthesia Preprocedure Evaluation (Signed)
 Anesthesia Evaluation  Patient identified by MRN, date of birth, ID band Patient awake    Reviewed: Allergy & Precautions, Patient's Chart, lab work & pertinent test results  History of Anesthesia Complications Negative for: history of anesthetic complications  Airway Mallampati: II  TM Distance: >3 FB Neck ROM: Full    Dental no notable dental hx.    Pulmonary asthma , former smoker   Pulmonary exam normal        Cardiovascular negative cardio ROS Normal cardiovascular exam     Neuro/Psych negative neurological ROS     GI/Hepatic negative GI ROS, Neg liver ROS,,,  Endo/Other  negative endocrine ROS    Renal/GU negative Renal ROS     Musculoskeletal negative musculoskeletal ROS (+)    Abdominal   Peds  Hematology negative hematology ROS (+)   Anesthesia Other Findings   Reproductive/Obstetrics (+) Pregnancy                              Anesthesia Physical Anesthesia Plan  ASA: 2  Anesthesia Plan: Epidural   Post-op Pain Management:    Induction:   PONV Risk Score and Plan: Treatment may vary due to age or medical condition  Airway Management Planned: Natural Airway  Additional Equipment: Fetal Monitoring  Intra-op Plan:   Post-operative Plan:   Informed Consent: I have reviewed the patients History and Physical, chart, labs and discussed the procedure including the risks, benefits and alternatives for the proposed anesthesia with the patient or authorized representative who has indicated his/her understanding and acceptance.       Plan Discussed with:   Anesthesia Plan Comments:          Anesthesia Quick Evaluation

## 2024-01-26 NOTE — Progress Notes (Signed)
 At this time, the patient is shaking so severely that the RN cannot get an accurate reading. Despite trying several hacks, the reading of the BP is still inaccurate.

## 2024-01-26 NOTE — Anesthesia Postprocedure Evaluation (Signed)
 Anesthesia Post Note  Patient: Hannah Harrison  Procedure(s) Performed: AN AD HOC LABOR EPIDURAL     Patient location during evaluation: Mother Baby Anesthesia Type: Epidural Level of consciousness: awake and alert and oriented Pain management: satisfactory to patient Vital Signs Assessment: post-procedure vital signs reviewed and stable Respiratory status: respiratory function stable Cardiovascular status: stable Postop Assessment: no headache, no backache, epidural receding, patient able to bend at knees, no signs of nausea or vomiting, adequate PO intake and able to ambulate Anesthetic complications: no   No notable events documented.  Last Vitals:  Vitals:   01/26/24 1210 01/26/24 1329  BP: (!) 140/87 (!) 144/83  Pulse: (!) 103 100  Resp: 18 20  Temp: 37.2 C   SpO2: 100% 100%    Last Pain:  Vitals:   01/26/24 1329  TempSrc: Oral  PainSc:    Pain Goal:                   [name]

## 2024-01-26 NOTE — Progress Notes (Signed)
 Patient ID: Hannah Harrison, female   DOB: February 20, 2006, 18 y.o.   MRN: [id number]  Subjective: -Patient s/p epidural and reports comfort.  Continues to deny HA, visual disturbances, edema, and RUQ pain.  Family remains at bedside.   Objective: BP 132/79   Pulse (!) 109   Temp 98 F (36.7 C) (Axillary)   Resp 20   LMP 03/27/2023 (Exact Date)   SpO2 100%  No intake/output data recorded. No intake/output data recorded.  Fetal Monitoring: FHT: 135 bpm, Mod Var, -Decels, +Accels UC: Q1-71min, palpates moderate    Vaginal Exam: SVE:   Dilation: 4 Effacement (%): 70 Station: -1 Exam by:: Harlene Duncans, CNM Membranes:AROM with clear fluid Internal Monitors: IUPC inserted  Augmentation/Induction: Pitocin:67mUn/min Cytotec: None  Assessment:  IUP at 39.4 weeks Cat I FT  Amniotomy   Plan: -Discussed AROM r/b including increased risk of infection, cord prolapse, fetal intolerance, and decreased labor time. No questions or concerns and patient desires to proceed with AROM.  -Discussed IUPC r/b, prior to insertion, including increased risk of infection and ability to adequately monitor quantity and strength of contractions. -Encouraged rest.  -Titrate pitocin as appropriate. -Dr. FREDRIK Buddle to be updated on patient status.    Harlene LITTIE Duncans LAFE, CNM Advanced Practice Provider, Center for [facility] 01/26/2024, 1:40 AM

## 2024-01-26 NOTE — Discharge Summary (Signed)
 Postpartum Discharge Summary  Date of Service updated***     Patient Name: Hannah Harrison DOB: 09-17-2005 MRN: [id number]  Date of admission: 01/25/2024 Delivery date:01/26/2024 Delivering provider: DANNY GERALDS Date of discharge: 01/26/2024  Admitting diagnosis: Supervision of normal pregnancy in third trimester [Z34.93] Intrauterine pregnancy: [redacted]w[redacted]d     Secondary diagnosis:  Active Problems:   * No active hospital problems. *  Additional problems: ***    Discharge diagnosis: Term Pregnancy Delivered and Preeclampsia (mild)                                              Post partum procedures:{Postpartum procedures:23558} Augmentation: AROM and Pitocin Complications: None  Hospital course: Induction of Labor With Vaginal Delivery   18 y.o. yo G1P0000 at [redacted]w[redacted]d was admitted to the hospital 01/25/2024 for induction of labor.  Indication for induction: Preeclampsia.  Patient had an labor course complicated by none. Membrane Rupture Time/Date: 1:48 AM,01/26/2024  Delivery Method:Vaginal, Spontaneous Operative Delivery:N/A Details of delivery can be found in separate delivery note.    Patient had a postpartum course complicated by***. Patient is discharged home 01/26/24.  Newborn Data: Birth date:01/26/2024 Birth time:9:24 AM Gender:Female Living status:Living Apgars:5 ,  Weight:3420 g  Magnesium Sulfate received: {Mag received:30440022} BMZ received: No Rhophylac:N/A MMR:N/A T-DaP:Given prenatally Flu: {Qol:76036} RSV Vaccine received: No Transfusion:{Transfusion received:30440034}  Immunizations received: Immunization History  Administered Date(s) Administered   Tdap 11/01/2023    Physical exam  Vitals:   01/26/24 0910 01/26/24 0924 01/26/24 0927 01/26/24 0930  BP: (!) 147/72 (!) 142/75 128/73 (!) 130/59  Pulse: (!) 212 (!) 143 (!) 133 (!) 113  Resp:      Temp:      TempSrc:      SpO2:       General: {Exam; general:21111117} Lochia: {Desc;  appropriate/inappropriate:30686::appropriate} Uterine Fundus: {Desc; firm/soft:30687} Incision: {Exam; incision:21111123} DVT Evaluation: {Exam; dvt:2111122} Labs: Lab Results  Component Value Date   WBC 11.8 (H) 01/25/2024   HGB 12.0 01/25/2024   HCT 37.0 01/25/2024   MCV 89.4 01/25/2024   PLT 225 01/25/2024      Latest Ref Rng & Units 01/25/2024    2:15 PM  CMP  Glucose 70 - 99 mg/dL 891   BUN 6 - 20 mg/dL 6   Creatinine 9.55 - 8.99 mg/dL 9.39   Sodium 864 - 854 mmol/L 135   Potassium 3.5 - 5.1 mmol/L 3.7   Chloride 98 - 111 mmol/L 103   CO2 22 - 32 mmol/L 22   Calcium 8.9 - 10.3 mg/dL 9.3   Total Protein 6.5 - 8.1 g/dL 7.0   Total Bilirubin 0.0 - 1.2 mg/dL 0.3   Alkaline Phos 38 - 126 U/L 267   AST 15 - 41 U/L 26   ALT 0 - 44 U/L 15    Edinburgh Score:     No data to display         No data recorded  After visit meds:  Allergies as of 01/26/2024       Reactions   Other    Seasonal allergies   Peanut-containing Drug Products    Other Reaction(s): Unknown   Peanuts [peanut Oil] Other (See Comments)   Throat swelling   Shellfish Allergy    Hives and throat swelling Other Reaction(s): Unknown     Med Rec must be completed prior to  using this SMARTLINK***        Discharge home in stable condition Infant Feeding: Breast Infant Disposition:{CHL IP OB HOME WITH FNUYZM:76418} Discharge instruction: per After Visit Summary and Postpartum booklet. Activity: Advance as tolerated. Pelvic rest for 6 weeks.  Diet: routine diet Future Appointments: Future Appointments  Date Time Provider Department Center  01/30/2024  2:50 PM Davis, Devon E, PA-C [facility] None  02/05/2024  7:00 AM [facility] SCHED ROOM [facility] None   Follow up Visit: Message sent 01/26/2024   Please schedule this patient for a In person postpartum visit in 6 weeks with the following provider: Any provider. Additional Postpartum F/U:Postpartum Depression checkup and BP check 1 week Low  risk pregnancy complicated by: preE at term --> IOL Delivery mode:  Vaginal, Spontaneous Anticipated Birth Control:  POPs   01/26/2024 Barabara Maier, DO

## 2024-01-26 NOTE — Anesthesia Procedure Notes (Signed)
 Epidural Patient location during procedure: OB Start time: 01/26/2024 1:10 AM End time: 01/26/2024 1:13 AM  Staffing Anesthesiologist: Paul Lamarr BRAVO, MD Performed: anesthesiologist   Preanesthetic Checklist Completed: patient identified, IV checked, risks and benefits discussed, monitors and equipment checked, pre-op evaluation and timeout performed  Epidural Patient position: sitting Prep: DuraPrep and site prepped and draped Patient monitoring: continuous pulse ox, blood pressure and heart rate Approach: midline Location: L3-L4 Injection technique: LOR air  Needle:  Needle type: Tuohy  Needle gauge: 17 G Needle length: 9 cm Needle insertion depth: 5 cm Catheter type: closed end flexible Catheter size: 19 Gauge Catheter at skin depth: 10 cm Test dose: negative and Other (1% lidocaine )  Assessment Events: blood not aspirated, no cerebrospinal fluid, injection not painful, no injection resistance, no paresthesia and negative IV test  Additional Notes Patient identified. Risks, benefits, and alternatives discussed with patient including but not limited to bleeding, infection, nerve damage, paralysis, failed block, incomplete pain control, headache, blood pressure changes, nausea, vomiting, reactions to medication, itching, and postpartum back pain. Confirmed with bedside nurse the patient's most recent platelet count. Confirmed with patient that they are not currently taking any anticoagulation, have any bleeding history, or any family history of bleeding disorders. Patient expressed understanding and wished to proceed. All questions were answered. Sterile technique was used throughout the entire procedure. Please see nursing notes for vital signs.   Crisp LOR on first pass. Test dose was given through epidural catheter and negative prior to continuing to dose epidural or start infusion. Warning signs of high block given to the patient including shortness of breath,  tingling/numbness in hands, complete motor block, or any concerning symptoms with instructions to call for help. Patient was given instructions on fall risk and not to get out of bed. All questions and concerns addressed with instructions to call with any issues or inadequate analgesia.  Reason for block:procedure for pain

## 2024-01-26 NOTE — Lactation Note (Signed)
 This note was copied from a baby's chart. Lactation Consultation Note  Patient Name: Hannah Harrison Unijb'd Date: 01/26/2024 Age:20 hours Reason for consult: Initial assessment;1st time breastfeeding  P1, Mother attempting to latch on the right breast while pumping with her Momcozy pump on the left breast. Reviewed hand expression with drops expressed. Assisted with latching on the R breast. Baby latched with ease, good depth.  Intermittent swallows noted. Feed on demand with cues.  Goal 8-12+ times per day after first 24 hrs.  Place baby STS if not cueing.   Encouraged offering the breast before formula. Mother pumped approx 5 ml earlier.  Reviewed milk storage, provided supplies for cleaning and suggest giving to baby after feeding.  Reviewed volume expectations and frequency.   Maternal Data Has patient been taught Hand Expression?: Yes Does the patient have breastfeeding experience prior to this delivery?: No  Feeding Mother's Current Feeding Choice: Breast Milk and Formula  LATCH Score Latch: Grasps breast easily, tongue down, lips flanged, rhythmical sucking.  Audible Swallowing: A few with stimulation  Type of Nipple: Everted at rest and after stimulation  Comfort (Breast/Nipple): Soft / non-tender  Hold (Positioning): Assistance needed to correctly position infant at breast and maintain latch.  LATCH Score: 8  Interventions Interventions: Breast feeding basics reviewed;Assisted with latch;Hand express;Support pillows;Education;LC Services brochure;CDC milk storage guidelines  Discharge Pump: Personal;Hands Free (Momcozy)  Consult Status Consult Status: Follow-up Date: 01/27/24 Follow-up type: In-patient   Shannon Levorn Lemme  RN, IBCLC 01/26/2024, 2:29 PM

## 2024-01-26 NOTE — Progress Notes (Signed)
 At 1210 on admission to [facility] B/P 140/87 and after 1 hour recheck B/P 144/83. Asymptomatic. Notified Dr Danny at 1435.

## 2024-01-27 DIAGNOSIS — Z8759 Personal history of other complications of pregnancy, childbirth and the puerperium: Secondary | ICD-10-CM

## 2024-01-27 MED ORDER — NORETHINDRONE 0.35 MG PO TABS: 1.0000 | ORAL_TABLET | Freq: Every day | ORAL | 0 refills | Status: AC

## 2024-01-27 MED ORDER — POTASSIUM CHLORIDE CRYS ER 20 MEQ PO TBCR: 20.0000 meq | EXTENDED_RELEASE_TABLET | Freq: Every day | ORAL | 0 refills | Status: AC

## 2024-01-27 MED ORDER — IBUPROFEN 600 MG PO TABS: 600.0000 mg | ORAL_TABLET | Freq: Four times a day (QID) | ORAL | 0 refills | Status: AC

## 2024-01-27 MED ORDER — FUROSEMIDE 20 MG PO TABS: 20.0000 mg | ORAL_TABLET | Freq: Every day | ORAL | 0 refills | Status: AC

## 2024-01-27 MED ORDER — ACETAMINOPHEN 500 MG PO TABS: 1000.0000 mg | ORAL_TABLET | Freq: Four times a day (QID) | ORAL | 0 refills | Status: AC

## 2024-01-27 NOTE — Progress Notes (Signed)
 POSTPARTUM PROGRESS NOTE  Post Partum Day 1  Subjective:  Hannah Harrison is a 18 y.o. G1P1001 s/p SVD at [redacted]w[redacted]d.  She reports she is doing well. No acute events overnight. She denies any problems with ambulating, voiding or po intake. Denies nausea or vomiting.  Pain is well controlled.  Lochia is Normal.  Objective: Blood pressure 117/79, pulse 91, temperature 98 F (36.7 C), temperature source Oral, resp. rate 16, last menstrual period 03/27/2023, SpO2 99%, unknown if currently breastfeeding.  BP Readings from Last 3 Encounters:  01/27/24 117/79  01/23/24 133/79  01/15/24 124/79    Physical Exam:  General: alert, cooperative and no distress Chest: no respiratory distress Heart:regular rate, distal pulses intact Uterine Fundus: firm, appropriately tender Extremities: none edema Skin: warm, dry  Recent Labs    01/25/24 2203 01/26/24 0946  HGB 12.0 11.7*  HCT 37.0 35.9*    Assessment/Plan: Hannah Harrison is a 18 y.o. G1P1001 s/p NSVD at [redacted]w[redacted]d   PPD# 1 - Doing well  Routine postpartum care  Delivery Complications: none and preeclampsia Blood Pressure: abnormal: elevated, start Lasix/KCl Anemia/Hb Status: Appropriate PP Hb Contraception: Progesterone -only Oral Contraceptive Pills Feeding: breast feeding PreEclampsia: on lasix/K, babyRx  Dispo: Plan for discharge today or tomorrow.   LOS: 2 days   Leeroy KATHEE Pouch, MD OB Fellow  01/27/2024, 7:57 AM

## 2024-01-27 NOTE — Clinical Social Work Maternal (Addendum)
 CLINICAL SOCIAL WORK MATERNAL/CHILD NOTE  Patient Details  Name: Hannah Harrison MRN: [id number] Date of Birth: 04/08/2005  Date:  01-10-2024  Clinical Social Worker Initiating Note:  Sharyne Roulette, LCSWA Date/Time: Initiated:  01/27/24/1437     Child's Name:  Hannah Harrison   Biological Parents:  Mother, Father (FOB: Rudolpho Sharps, DOB: 11/05/1996)   Need for Interpreter:  None   Reason for Referral:  Behavioral Health Concerns, Current Substance Use/Substance Use During Pregnancy     Address:  [address] [place] [address]    Phone number:  [phone] (home)     Additional phone number:   Household Members/Support Persons (HM/SP):   Household Member/Support Person 1   HM/SP Name Relationship DOB or Age  HM/SP -1 Trejean Smith FOB 11/05/1996  HM/SP -2        HM/SP -3        HM/SP -4        HM/SP -5        HM/SP -6        HM/SP -7        HM/SP -8          Natural Supports (not living in the home):  Immediate Family, Extended Family   Professional Supports: None   Employment: Full-time   Type of Work: Economist at [employer]   Education:  [employer] school graduate   Homebound arranged:    Surveyor, Quantity Resources:  Medicaid   Other Resources:  [facility]   Cultural/Religious Considerations Which May Impact Care:  Per [employer] Sheet, MOB identifies as Curator  Strengths:  Ability to meet basic needs  , Home prepared for child  , Pediatrician chosen   Psychotropic Medications:         Pediatrician:    [facility] area  Pediatrician List:   [facility]  [place]     [facility]      Pediatrician Fax Number:    Risk Factors/Current Problems:  Substance Use     Cognitive State:  Alert  , Goal Oriented  , Linear Thinking  , Able to Concentrate     Mood/Affect:  Comfortable  , Calm   , Interested  , Relaxed     CSW Assessment: CSW was consulted due to [facility] use during pregnancy and history of adjustment disorder with an intentional overdose in 2020. CSW met with MOB at bedside to complete assessment. When CSW entered room, MOB was observed sitting in hospital bed. Infant was asleep on her back in bassinet and FOB was present sitting nearby. CSW introduced self and requested to speak with MOB alone. MOB provided verbal consent for FOB to remain in the room during consult. CSW explained reason for consult. MOB presented as calm, was agreeable to consult and remained engaged throughout encounter.   MOB confirmed demographic information on file. CSW assessed for current mood and mental health history. MOB reports feeling good since infant's arrival. MOB acknowledged history of an intentional overdose in 2020, stating initially that she felt she took the medication for attention at the time and then shared she does not know why she did it but states she was experiencing conflict with her family members. Per chart review, MOB presented to the ED in 2020 following intentional ingestion of 2-4 paroxetine. MOB denied mental health concerns and/or symptoms since 2020. MOB  reports a stable mood during pregnancy. CSW inquired about supports. MOB reports she has great family support, and identified FOB, her mom, step mom, and FOB's family as supportive. MOB denied current SI/HI. CSW provided education regarding the baby blues period vs. perinatal mood disorders, discussed treatment and gave resources for mental health follow up if concerns arise.  CSW recommends self-evaluation during the postpartum time period using the New Mom Checklist from Postpartum Progress and encouraged MOB to contact a medical professional if symptoms are noted at any time.    MOB reports she has all needed items for infant, including a car seat and bassinet. MOB receives [facility] benefits and reports she needs to recertify for  food stamps but plans to do so. MOB denied transportation issues and denied additional resource needs.  CSW informed MOB about hospital drug screen policy due to documented maternal THC use during pregnancy. CSW explained that infant's UDS resulted negative for illicit substances; however, infant's CDS would be monitored and a CPS report would be made if warranted. MOB expressed understanding. CSW inquired about substance use during pregnancy. MOB acknowledged THC use during pregnancy reporting she last smoked marijuana about 2 months ago. MOB denied using other illicit substances during pregnancy.  CSW provided review of Sudden Infant Death Syndrome (SIDS) precautions.    CSW identifies no further need for intervention and no barriers to discharge at this time.  CSW Plan/Description:  No Further Intervention Required/No Barriers to Discharge, Sudden Infant Death Syndrome (SIDS) Education, Perinatal Mood and Anxiety Disorder (PMADs) Education, Hospital Drug Screen Policy Information, CSW Will Continue to Monitor Umbilical Cord Tissue Drug Screen Results and Make Report if Warranted    Delante Karapetyan K Camara Rosander, LCSWA 02-11-2024, 2:41 PM

## 2024-01-27 NOTE — Progress Notes (Signed)
 Set patient up with babyscripts hypertension protocol app. Assisted patient to log in and enter blood pressure. Data visible in Episodes of Care tab. Also provided patient with automatic blood pressure cuff and instructed on how to use device. Tammy, Steva Espanola

## 2024-01-30 ENCOUNTER — Encounter: Admitting: Physician Assistant

## 2024-01-31 ENCOUNTER — Inpatient Hospital Stay (HOSPITAL_COMMUNITY)
Admission: AD | Admit: 2024-01-31 | Discharge: 2024-01-31 | Disposition: A | Discharge status: Home / Self Care | Attending: Obstetrics & Gynecology | Admitting: Obstetrics & Gynecology

## 2024-01-31 ENCOUNTER — Other Ambulatory Visit: Payer: Self-pay

## 2024-01-31 DIAGNOSIS — R10A2 Flank pain, left side: Secondary | ICD-10-CM | POA: Diagnosis present

## 2024-01-31 DIAGNOSIS — O9089 Other complications of the puerperium, not elsewhere classified: Secondary | ICD-10-CM | POA: Insufficient documentation

## 2024-01-31 DIAGNOSIS — S76212A Strain of adductor muscle, fascia and tendon of left thigh, initial encounter: Secondary | ICD-10-CM

## 2024-01-31 DIAGNOSIS — R1022 Pelvic and perineal pain left side: Secondary | ICD-10-CM | POA: Insufficient documentation

## 2024-01-31 DIAGNOSIS — R1032 Left lower quadrant pain: Secondary | ICD-10-CM

## 2024-01-31 MED ORDER — OXYCODONE HCL 5 MG PO TABS: 5.0000 mg | ORAL_TABLET | Freq: Three times a day (TID) | ORAL | 0 refills | Status: AC | PRN

## 2024-01-31 NOTE — MAU Note (Signed)
 Hannah Harrison is a 18 y.o. at [redacted]w[redacted]d here in MAU reporting: she's having constant left sided pain that began today @ 1400.  States pain is sharp and persistent, not cramping.  Reports took Ibuprofen  @ 1300, no relief.   PP NVD 01/26/2024  LMP: NA Onset of complaint: today Pain score: 8 Vitals:   01/31/24 1600  BP: 128/79  Pulse: 86  Resp: 18  Temp: 98.9 F (37.2 C)  SpO2: 99%     FHT: NA  Lab orders placed from triage: None

## 2024-01-31 NOTE — MAU Provider Note (Signed)
 History     [id number]  Arrival date and time: 01/31/24 1521    Chief Complaint  Patient presents with   Abdominal Pain     HPI Hannah Harrison is a 18 y.o. postpartum day #5 who presents to MAU with mild left groin pain. Started this morning. Maternity support band has been helping. Movement makes the pain worse. Endorses normal lochia. Denies vaginal irritation, itching, dysuria, increased urinary frequency or urgency. Took ibuprofen  with minimal improvement. Has not tried anything other medications for the pain. She is not too concerned about the pain but wanted to come get checked out just to make sure. Had normal SVD with no complications and no postpartum complications. Denies headache, vision changes, RUQ pain.    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   Scoliosis     Past Surgical History:  Procedure Laterality Date   NO PAST SURGERIES      Family History  Problem Relation Age of Onset   Healthy Mother    Healthy Father    Hyperlipidemia Maternal Grandmother     Social History   Socioeconomic History   Marital status: Single    Spouse name: Not on file   Number of children: Not on file   Years of education: Not on file   Highest education level: Not on file  Occupational History   Not on file  Tobacco Use   Smoking status: Former    Types: Cigars   Smokeless tobacco: Never  Vaping Use   Vaping status: Never Used  Substance and Sexual Activity   Alcohol use: Not Currently    Comment: Occ   Drug use: Not Currently    Types: Marijuana    Comment: Stopped in September 2025   Sexual activity: Yes    Partners: Male    Birth control/protection: None  Other Topics Concern   Not on  file  Social History Narrative   ** Merged History Encounter **       ** Merged History Encounter **       Social Drivers of Corporate Investment Banker Strain: Not on file  Food Insecurity: No Food Insecurity (12/28/2023)   Hunger Vital Sign    Worried About Running Out of Food in the Last Year: Never true    Ran Out of Food in the Last Year: Never true  Transportation Needs: No Transportation Needs (12/28/2023)   PRAPARE - Administrator, Civil Service (Medical): No    Lack of Transportation (Non-Medical): No  Physical Activity: Not on file  Stress: Not on file  Social Connections: Not on file  Intimate Partner Violence: Not At Risk (12/28/2023)   Humiliation, Afraid, Rape, and Kick questionnaire    Fear of Current or Ex-Partner: No    Emotionally Abused: No    Physically Abused: No    Sexually Abused: No    Allergies  Allergen Reactions   Other     Seasonal allergies    Peanut-Containing Drug Products     Other Reaction(s): Unknown   Peanuts [Peanut Oil] Other (See Comments)    Throat swelling   Shellfish Allergy     Hives and throat swelling  Other Reaction(s): Unknown    No current  facility-administered medications on file prior to encounter.   Current Outpatient Medications on File Prior to Encounter  Medication Sig Dispense Refill   acetaminophen  (TYLENOL ) 500 MG tablet Take 2 tablets (1,000 mg total) by mouth every 6 (six) hours. 30 tablet 0   furosemide  (LASIX ) 20 MG tablet Take 1 tablet (20 mg total) by mouth daily. 4 tablet 0   ibuprofen  (ADVIL ) 600 MG tablet Take 1 tablet (600 mg total) by mouth every 6 (six) hours. 30 tablet 0   potassium chloride  SA (KLOR-CON  M) 20 MEQ tablet Take 1 tablet (20 mEq total) by mouth daily. 4 tablet 0   Prenatal 28-0.8 MG TABS Take 1 tablet by mouth daily. 30 tablet 12   albuterol  (VENTOLIN  HFA) 108 (90 Base) MCG/ACT inhaler Inhale 2 puffs into the lungs every 4 (four) hours as needed for wheezing or shortness  of breath.     Blood Pressure Monitoring (BLOOD PRESSURE KIT) DEVI 1 kit by Does not apply route once a week. Check Blood Pressure regularly and record readings into the Babyscripts App.  Large Cuff.  DX O90.0 (Patient not taking: Reported on 01/23/2024) 1 each 0   norethindrone  (MICRONOR ) 0.35 MG tablet Take 1 tablet (0.35 mg total) by mouth daily. 60 tablet 0    Pertinent positives and negative per HPI, all others reviewed and negative  Physical Exam   BP 128/79 (BP Location: Right Arm)   Pulse 86   Temp 98.9 F (37.2 C) (Oral)   Resp 18   Ht 5' 2 (1.575 m)   Wt 74.9 kg   LMP 03/27/2023 (Exact Date)   SpO2 99%   BMI 30.22 kg/m   Patient Vitals for the past 24 hrs:  BP Temp Temp src Pulse Resp SpO2 Height Weight  01/31/24 1600 128/79 98.9 F (37.2 C) Oral 86 18 99 % -- --  01/31/24 1553 -- -- -- -- -- -- 5' 2 (1.575 m) 74.9 kg    Physical Exam Vitals and nursing note reviewed.  Constitutional:      Appearance: She is well-developed.  HENT:     Head: Normocephalic and atraumatic.     Mouth/Throat:     Mouth: Mucous membranes are moist.  Eyes:     Extraocular Movements: Extraocular movements intact.  Cardiovascular:     Rate and Rhythm: Normal rate and regular rhythm.  Pulmonary:     Effort: Pulmonary effort is normal.  Abdominal:     Palpations: Abdomen is soft.     Tenderness: There is abdominal tenderness in the left lower quadrant. There is no right CVA tenderness, left CVA tenderness, guarding or rebound.     Comments: Tenderness to deep palpation in the LLQ. Reproducible with range of motion.   Skin:    Capillary Refill: Capillary refill takes less than 2 seconds.  Neurological:     General: No focal deficit present.     Mental Status: She is alert.      Labs No results found for this or any previous visit (from the past 24 hours).  Imaging No results found.  MAU Course  Procedures Lab Orders  No laboratory test(s) ordered today   Meds ordered  this encounter  Medications   oxyCODONE  (ROXICODONE ) 5 MG immediate release tablet    Sig: Take 1 tablet (5 mg total) by mouth every 8 (eight) hours as needed for up to 10 doses.    Dispense:  10 tablet    Refill:  0   Imaging Orders  No imaging studies ordered today    MDM Low (Level 2)  Assessment and Plan    Hannah Harrison is a 18 y.o. postpartum day #5 who presents to MAU with mild left groin pain.  -Mild tenderness to deep palpation of the left groin that doesn't radiate. No urinary or vaginal symptoms. This is consistent with adductor muscle strain s/p vaginal delivery.  -Stable for discharge home -Continue using ibuprofen  and tylenol  for pain relief -Continue use of maternity band -Encourage physical therapy and frequent exercise.  -Short course of oxycodone  sent to patient pharmacy -Encouraged to keep postpartum follow up appointments.   Vandora Jaskulski L Amzie Sillas, MD/MHA 01/31/24 5:01 PM  Allergies as of 01/31/2024       Reactions   Other    Seasonal allergies   Peanut-containing Drug Products    Other Reaction(s): Unknown   Peanuts [peanut Oil] Other (See Comments)   Throat swelling   Shellfish Allergy    Hives and throat swelling Other Reaction(s): Unknown        Medication List     TAKE these medications    acetaminophen  500 MG tablet Commonly known as: TYLENOL  Take 2 tablets (1,000 mg total) by mouth every 6 (six) hours.   albuterol  108 (90 Base) MCG/ACT inhaler Commonly known as: VENTOLIN  HFA Inhale 2 puffs into the lungs every 4 (four) hours as needed for wheezing or shortness of breath.   Blood Pressure Kit Devi 1 kit by Does not apply route once a week. Check Blood Pressure regularly and record readings into the Babyscripts App.  Large Cuff.  DX O90.0   furosemide  20 MG tablet Commonly known as: LASIX  Take 1 tablet (20 mg total) by mouth daily.   ibuprofen  600 MG tablet Commonly known as: ADVIL  Take 1 tablet (600 mg total) by mouth  every 6 (six) hours.   norethindrone  0.35 MG tablet Commonly known as: MICRONOR  Take 1 tablet (0.35 mg total) by mouth daily.   oxyCODONE  5 MG immediate release tablet Commonly known as: Roxicodone  Take 1 tablet (5 mg total) by mouth every 8 (eight) hours as needed for up to 10 doses.   potassium chloride  SA 20 MEQ tablet Commonly known as: KLOR-CON  M Take 1 tablet (20 mEq total) by mouth daily.   Prenatal 28-0.8 MG Tabs Take 1 tablet by mouth daily.

## 2024-02-04 ENCOUNTER — Ambulatory Visit

## 2024-02-04 VITALS — BP 124/79 | HR 77 | Wt 171.1 lb

## 2024-02-04 DIAGNOSIS — Z1331 Encounter for screening for depression: Secondary | ICD-10-CM

## 2024-02-04 DIAGNOSIS — Z0130 Encounter for examination of blood pressure without abnormal findings: Secondary | ICD-10-CM

## 2024-02-04 NOTE — Progress Notes (Signed)
 Hannah Harrison  G1P1001 here for postpartum BP and depression screen. Pt is currently 9 days postpartum. Pt reports doing well and that she has no suicidal/homicidal inclination.   BP in office today is 124/79, Pulse 77 Pt denies headaches, dizziness, visual changes    Discussed with provider results of Edinburgh Score of 0.  Pt to follow up on 03/10/24 for postpartum visit. Advised if abnormal symptoms occur, notify office or report to mau, pt voiced understanding.

## 2024-02-05 ENCOUNTER — Inpatient Hospital Stay (HOSPITAL_COMMUNITY)

## 2024-02-05 ENCOUNTER — Inpatient Hospital Stay (HOSPITAL_COMMUNITY): Admission: RE | Admit: 2024-02-05 | Source: Home / Self Care | Admitting: Family Medicine

## 2024-03-10 ENCOUNTER — Ambulatory Visit: Admitting: Obstetrics and Gynecology

## 2024-03-10 ENCOUNTER — Encounter: Payer: Self-pay | Admitting: Obstetrics and Gynecology

## 2024-03-10 DIAGNOSIS — Z3202 Encounter for pregnancy test, result negative: Secondary | ICD-10-CM

## 2024-03-10 LAB — POCT URINE PREGNANCY: Preg Test, Ur: NEGATIVE

## 2024-03-10 NOTE — Progress Notes (Signed)
 Pt wants to start [facility], she is sexually active, not using anything for [facility].  Last unprotected sex was 2 days ago.  UPT today  Negative     Post Partum Visit Note  Hannah Harrison is a 18 y.o. G10P1001 female who presents for a postpartum visit. She is 6 weeks postpartum following a normal spontaneous vaginal delivery.  I have fully reviewed the prenatal and intrapartum course. The delivery was at 39.4 gestational weeks.  Anesthesia: epidural. Postpartum course has been unremarkable. Baby is doing well. Baby is feeding by bottle - Similac Total Comfort. Bleeding no bleeding. Bowel function is normal. Bladder function is normal. Patient is sexually active several times since delivery without contraception. Patient is planning to have Nexplanon for contraception. Postpartum depression screening: negative.   Upstream - 03/10/24 1056       Pregnancy Intention Screening   Does the patient want to become pregnant in the next year? No    Does the patient's partner want to become pregnant in the next year? No    Would the patient like to discuss contraceptive options today? Yes      Contraception Wrap Up   Current Method No Contraceptive Precautions         The pregnancy intention screening data noted above was reviewed. Potential methods of contraception were discussed. The patient elected to proceed with No data recorded.   Edinburgh Postnatal Depression Scale - 03/10/24 1055       Edinburgh Postnatal Depression Scale:  In the Past 7 Days   I have been able to laugh and see the funny side of things. 0    I have looked forward with enjoyment to things. 0    I have blamed myself unnecessarily when things went wrong. 0    I have been anxious or worried for no good reason. 0    I have felt scared or panicky for no good reason. 0    Things have been getting on top of me. 0    I have been so unhappy that I have had difficulty sleeping. 0    I have felt sad or miserable. 0    I have been so  unhappy that I have been crying. 0    The thought of harming myself has occurred to me. 0    Edinburgh Postnatal Depression Scale Total 0          Health Maintenance Due  Topic Date Due   HPV VACCINES (1 - 3-dose series) Never done   Meningococcal B Vaccine (1 of 2 - Standard) Never done   Pneumococcal Vaccine (1 of 2 - PCV) Never done   Hepatitis B Vaccines 19-59 Average Risk (1 of 3 - 19+ 3-dose series) Never done   COVID-19 Vaccine (1 - 2025-26 season) Never done       Review of Systems Pertinent items noted in HPI and remainder of comprehensive ROS otherwise negative.  Objective:  BP 114/69   Pulse 61   Ht 5' 2 (1.575 m)   Wt 163 lb (73.9 kg)   LMP 03/27/2023 (Exact Date)   Breastfeeding No   BMI 29.81 kg/m    General:  alert, cooperative, and no distress   Breasts:  normal  Lungs: clear to auscultation bilaterally  Heart:  regular rate and rhythm  Abdomen: soft, non-tender; bowel sounds normal; no masses,  no organomegaly   Wound  N/A  GU exam:  not indicated       Assessment:  Normal postpartum exam.   Plan:   Essential components of care per ACOG recommendations:  1.  Mood and well being: Patient with negative depression screening today.  - Patient tobacco use? No.   - hx of drug use? No.    2. Infant care and feeding:  -Patient currently breastmilk feeding? No. Patient plans to return to work on 03/14/2024 -Social determinants of health (SDOH) reviewed in [place]. No concerns  3. Sexuality, contraception and birth spacing - Patient does not want a pregnancy in the next year.  Desired family size is 2 children.  - Reviewed reproductive life planning. Reviewed contraceptive methods based on pt preferences and effectiveness.  Patient desired Hormonal Implant and it will be inserted in 2 weeks.   - Discussed birth spacing of 18 months  4. Sleep and fatigue -Encouraged family/partner/community support of 4 hrs of uninterrupted sleep to help with mood and  fatigue  5. Physical Recovery  - Discussed patients delivery and complications. She describes her labor as good. - Patient had a Vaginal, no problems at delivery. Patient had  no laceration. Perineal healing reviewed. Patient expressed understanding - Patient has urinary incontinence? No. - Patient is safe to resume physical and sexual activity  6.  Health Maintenance - HM due items addressed Yes  7. Chronic Disease/Pregnancy Condition follow up: None  - PCP follow up     [facility] [employer], [facility]

## 2024-03-16 ENCOUNTER — Ambulatory Visit (HOSPITAL_COMMUNITY)
Admission: EM | Admit: 2024-03-16 | Discharge: 2024-03-16 | Disposition: A | Discharge status: Home / Self Care | Attending: Internal Medicine | Admitting: Internal Medicine

## 2024-03-16 ENCOUNTER — Encounter (HOSPITAL_COMMUNITY): Payer: Self-pay | Admitting: *Deleted

## 2024-03-16 ENCOUNTER — Other Ambulatory Visit: Payer: Self-pay

## 2024-03-16 DIAGNOSIS — N898 Other specified noninflammatory disorders of vagina: Secondary | ICD-10-CM | POA: Diagnosis not present

## 2024-03-16 DIAGNOSIS — J3489 Other specified disorders of nose and nasal sinuses: Secondary | ICD-10-CM | POA: Insufficient documentation

## 2024-03-16 DIAGNOSIS — J3089 Other allergic rhinitis: Secondary | ICD-10-CM | POA: Diagnosis not present

## 2024-03-16 LAB — POCT INFLUENZA A/B
Influenza A, POC: NEGATIVE
Influenza B, POC: NEGATIVE

## 2024-03-16 LAB — POC SOFIA SARS ANTIGEN FIA: SARS Coronavirus 2 Ag: NEGATIVE

## 2024-03-16 MED ORDER — FLUTICASONE PROPIONATE 50 MCG/ACT NA SUSP: 1.0000 | Freq: Every day | NASAL | 1 refills | Status: AC

## 2024-03-16 MED ORDER — CETIRIZINE HCL 10 MG PO TABS: 10.0000 mg | ORAL_TABLET | Freq: Every day | ORAL | 0 refills | Status: AC

## 2024-03-16 NOTE — ED Triage Notes (Addendum)
 C/O rhinorrhea and sneezing onset 3 days ago. Denies any other sxs. Has tried Mucinex and Sudafed.  Pt also requesting STI testing; states she is 6-7 wks postpartum and has resumed sexually activity, so would like to be tested. Denies any vaginal sxs.

## 2024-03-16 NOTE — ED Provider Notes (Signed)
 " [facility]    CSN: [id number] Arrival date & time: 03/16/24  1137      History   Chief Complaint Chief Complaint  Patient presents with   Rhinorrhea   STI Testing    HPI Hannah Harrison is a 19 y.o. female.   Hannah Harrison is a 19 y.o. female presenting for chief complaint of rhinorrhea and sneezing for the last 3 days. She reports some sore throat in the morning when she wakes up and then it goes away throughout the day without intervention. Denies cough, shortness of breath, chest pain, nausea/vomiting, diarrhea, rashes, diarrhea, or recent sick contacts with similar symptoms. She has not received her flu vaccine this year. History of asthma, has needed albuterol  during this illness. History of allergies, she hasn't been taking daily antihistamine since in the last few years.   Patient would also like to be tested for BV and yeast as she is experiencing vaginal irritation after voiding.  Denies dysuria, urinary frequency, urinary urgency, gross hematuria, and abdominal/pelvic pain.  No nausea or vomiting.  No concern for STD, she is sexually active and monogamous relationship with her female partner unprotected.  Denies vaginal discharge and vaginal odor.  She states the vaginal irritation she is experiencing feels like an itching sensation but is not true itching.  She has had BV in the past with similar symptoms and would like to be tested. Denies chance of pregnancy, last menstrual cycle started on March 01, 2024.  She takes oral contraceptives without missed doses.     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   Asthma    Eczema    Intentional drug overdose (HCC) 02/28/2019   Intentional selective serotonin reuptake inhibitor overdose (HCC) 02/28/2019   Primary oligomenorrhea 01/06/2021   Scoliosis     Patient Active Problem List   Diagnosis Date Noted   SVD (spontaneous vaginal delivery)  01/27/2024   History of pre-eclampsia 01/27/2024   Other forms of scoliosis, thoracic region 12/23/2023   Mild intermittent asthma 12/23/2023   Flexural eczema 12/23/2023   History of suicidal ideation (2020) 09/04/2023   Supervision of other normal pregnancy, antepartum 06/14/2023   PCOS (polycystic ovarian syndrome) 01/06/2021    Past Surgical History:  Procedure Laterality Date   NO PAST SURGERIES      OB History     Gravida  1   Para  1   Term  1   Preterm  0   AB  0   Living  1      SAB  0   IAB  0   Ectopic  0   Multiple  0   Live Births  1            Home Medications    Prior to Admission medications  Medication Sig Start Date End Date Taking? Authorizing Provider  cetirizine  (ZYRTEC ) 10 MG tablet Take 1 tablet (10 mg total) by mouth at bedtime. 03/16/24  Yes Enedelia Dorna HERO, FNP  fluticasone  (FLONASE ) 50 MCG/ACT nasal spray Place 1 spray into both nostrils daily. 03/16/24  Yes Enedelia Dorna HERO, FNP  norethindrone  (MICRONOR ) 0.35 MG tablet Take 1 tablet (0.35 mg total) by mouth daily. 01/27/24  Yes Smith, Virginia , CNM  acetaminophen  (TYLENOL ) 500 MG tablet Take 2 tablets (1,000 mg total) by mouth every 6 (six) hours. 01/27/24   Smith, Virginia , CNM  albuterol  (VENTOLIN  HFA) 108 (90 Base) MCG/ACT inhaler Inhale  2 puffs into the lungs every 4 (four) hours as needed for wheezing or shortness of breath. 08/25/22   [provider]  Blood Pressure Monitoring (BLOOD PRESSURE KIT) DEVI 1 kit by Does not apply route once a week. Check Blood Pressure regularly and record readings into the [employer] App.  Large Cuff.  DX O90.0 07/16/20   Constant, Peggy, MD  furosemide  (LASIX ) 20 MG tablet Take 1 tablet (20 mg total) by mouth daily. Patient not taking: No sig reported 01/27/24   Claudene, Virginia , CNM  ibuprofen  (ADVIL ) 600 MG tablet Take 1 tablet (600 mg total) by mouth every 6 (six) hours. 01/27/24   Smith, Virginia , CNM  oxyCODONE  (ROXICODONE )  5 MG immediate release tablet Take 1 tablet (5 mg total) by mouth every 8 (eight) hours as needed for up to 10 doses. Patient not taking: No sig reported 01/31/24   Cashion, Colter L, MD  potassium chloride  SA (KLOR-CON  M) 20 MEQ tablet Take 1 tablet (20 mEq total) by mouth daily. Patient not taking: No sig reported 01/27/24   Claudene, Virginia , CNM  Prenatal 28-0.8 MG TABS Take 1 tablet by mouth daily. 06/07/23   Zina Jerilynn LABOR, MD    Family History Family History  Problem Relation Age of Onset   Healthy Mother    Healthy Father    Hyperlipidemia Maternal Grandmother     Social History Social History[1]   Allergies   Other, Peanut-containing drug products, Peanuts [peanut oil], and Shellfish allergy   Review of Systems Review of Systems Per HPI  Physical Exam Triage Vital Signs ED Triage Vitals  Encounter Vitals Group     BP 03/16/24 1148 121/74     Girls Systolic BP Percentile --      Girls Diastolic BP Percentile --      Boys Systolic BP Percentile --      Boys Diastolic BP Percentile --      Pulse Rate 03/16/24 1146 73     Resp 03/16/24 1146 16     Temp 03/16/24 1146 98.4 F (36.9 C)     Temp Source 03/16/24 1146 Oral     SpO2 03/16/24 1146 98 %     Weight --      Height --      Head Circumference --      Peak Flow --      Pain Score 03/16/24 1149 0     Pain Loc --      Pain Education --      Exclude from Growth Chart --    No data found.  Updated Vital Signs BP 121/74   Pulse 73   Temp 98.4 F (36.9 C) (Oral)   Resp 16   LMP 03/01/2024 (Approximate)   SpO2 98%   Breastfeeding No   Visual Acuity Right Eye Distance:   Left Eye Distance:   Bilateral Distance:    Right Eye Near:   Left Eye Near:    Bilateral Near:     Physical Exam Vitals and nursing note reviewed.  Constitutional:      Appearance: She is not ill-appearing or toxic-appearing.  HENT:     Head: Normocephalic and atraumatic.     Right Ear: Hearing and external ear normal.      Left Ear: Hearing and external ear normal.     Nose: Nose normal.     Mouth/Throat:     Lips: Pink.  Eyes:     General: Lids are normal. Vision grossly intact. Gaze aligned  appropriately.     Extraocular Movements: Extraocular movements intact.     Conjunctiva/sclera: Conjunctivae normal.  Pulmonary:     Effort: Pulmonary effort is normal.  Musculoskeletal:     Cervical back: Neck supple.  Skin:    General: Skin is warm and dry.     Capillary Refill: Capillary refill takes less than 2 seconds.     Findings: No rash.  Neurological:     General: No focal deficit present.     Mental Status: She is alert and oriented to person, place, and time. Mental status is at baseline.     Cranial Nerves: No dysarthria or facial asymmetry.  Psychiatric:        Mood and Affect: Mood normal.        Speech: Speech normal.        Behavior: Behavior normal.        Thought Content: Thought content normal.        Judgment: Judgment normal.      UC Treatments / Results  Labs (all labs ordered are listed, but only abnormal results are displayed) Labs Reviewed  POC SOFIA SARS ANTIGEN FIA  POCT INFLUENZA A/B  CERVICOVAGINAL ANCILLARY ONLY    EKG   Radiology No results found.  Procedures Procedures (including critical care time)  Medications Ordered in UC Medications - No data to display  Initial Impression / Assessment and Plan / UC Course  I have reviewed the triage vital signs and the nursing notes.  Pertinent labs & imaging results that were available during my care of the patient were reviewed by me and considered in my medical decision making (see chart for details).   1.  Nonseasonal allergic rhinitis due to other allergic trigger, rhinorrhea COVID and flu POC testing are both negative today. Symptoms due to allergic rhinitis. Discussed supportive care with OTC antihistamine, Flonase  daily.  Low suspicion for viral URI, therefore deferred testing. Lungs clear, vitals  hemodynamically stable, therefore deferred imaging of the chest.    2. Vaginal irritation BV and yeast labs pending, no concern for STI. Will wait to treat once labs return, patient is agreeable with this.  Discussed tips for BV/yeast prevention in the future. Recommend follow-up with OB/GYN PRN.   Counseled patient on potential for adverse effects with medications prescribed/recommended today, strict ER and return-to-clinic precautions discussed, patient verbalized understanding.    Final Clinical Impressions(s) / UC Diagnoses   Final diagnoses:  Rhinorrhea  Non-seasonal allergic rhinitis due to other allergic trigger  Vaginal irritation     Discharge Instructions      Your symptoms are likely due to environmental allergies.   Avoid exposure to allergens.  Take oral antihistamine Zyrtec  1 10mg  tablet at bedtime and use Flonase  daily as directed.  You can buy these medications over the counter.  These medications can take a few days to fully kick in to your body and start working.  Schedule an appointment with your primary care provider for follow-up and further management of your seasonal allergies as well as ongoing preventive healthcare.   Also, BV and yeast testing is pending and will come back in 2-3 days, you'll be able to see these results on your MyChart. Staff will call if your swab is abnormal and treat per protocol.      ED Prescriptions     Medication Sig Dispense Auth. Provider   cetirizine  (ZYRTEC ) 10 MG tablet Take 1 tablet (10 mg total) by mouth at bedtime. 30 tablet Ishika Chesterfield M,  FNP   fluticasone  (FLONASE ) 50 MCG/ACT nasal spray Place 1 spray into both nostrils daily. 16 g Enedelia Dorna HERO, FNP      PDMP not reviewed this encounter.     [1]  Social History Tobacco Use   Smoking status: Former    Types: Cigars   Smokeless tobacco: Never  Vaping Use   Vaping status: Never Used  Substance Use Topics   Alcohol use: Yes    Comment:  occasionally   Drug use: Yes    Types: Marijuana    Comment: occasionally     Enedelia Dorna HERO, FNP 03/16/24 1220  "

## 2024-03-16 NOTE — Discharge Instructions (Addendum)
 COVID and flu tests are both negative.  Your symptoms are likely due to environmental allergies.   Avoid exposure to allergens.  Take oral antihistamine Zyrtec  1 10mg  tablet at bedtime and use Flonase  daily as directed.  You can buy these medications over the counter.  These medications can take a few days to fully kick in to your body and start working.  Schedule an appointment with your primary care provider for follow-up and further management of your seasonal allergies as well as ongoing preventive healthcare.   Also, BV and yeast testing is pending and will come back in 2-3 days, you'll be able to see these results on your MyChart. Staff will call if your swab is abnormal and treat per protocol.

## 2024-03-17 ENCOUNTER — Ambulatory Visit (HOSPITAL_COMMUNITY): Payer: Self-pay

## 2024-03-17 LAB — CERVICOVAGINAL ANCILLARY ONLY
Bacterial Vaginitis (gardnerella): NEGATIVE
Candida Glabrata: NEGATIVE
Candida Vaginitis: POSITIVE — AB
Comment: NEGATIVE
Comment: NEGATIVE
Comment: NEGATIVE

## 2024-03-17 MED ORDER — FLUCONAZOLE 150 MG PO TABS: 150.0000 mg | ORAL_TABLET | Freq: Once | ORAL | 0 refills | Status: AC

## 2024-03-24 ENCOUNTER — Encounter: Payer: Self-pay | Admitting: Obstetrics and Gynecology

## 2024-03-26 ENCOUNTER — Ambulatory Visit: Payer: Self-pay | Admitting: Obstetrics and Gynecology

## 2024-04-24 ENCOUNTER — Ambulatory Visit: Payer: Self-pay | Admitting: Physician Assistant
